# Patient Record
Sex: Male | Born: 1960 | Race: White | Hispanic: No | Marital: Married | Smoking: Never smoker
Health system: Southern US, Community
[De-identification: ages and names within clinical notes are randomized; demographics above are authoritative.]

## PROBLEM LIST (undated history)

## (undated) DIAGNOSIS — G4700 Insomnia, unspecified: Secondary | ICD-10-CM

## (undated) DIAGNOSIS — R0602 Shortness of breath: Secondary | ICD-10-CM

## (undated) DIAGNOSIS — K635 Polyp of colon: Secondary | ICD-10-CM

## (undated) DIAGNOSIS — I4891 Unspecified atrial fibrillation: Secondary | ICD-10-CM

## (undated) DIAGNOSIS — R002 Palpitations: Secondary | ICD-10-CM

## (undated) HISTORY — PX: OTHER SURGICAL HISTORY: SHX169

## (undated) HISTORY — DX: Insomnia, unspecified: G47.00

## (undated) HISTORY — PX: HERNIA REPAIR: SHX51

## (undated) HISTORY — DX: Shortness of breath: R06.02

## (undated) HISTORY — DX: Palpitations: R00.2

## (undated) HISTORY — DX: Polyp of colon: K63.5

---

## 2015-04-29 ENCOUNTER — Emergency Department (HOSPITAL_COMMUNITY): Payer: 59

## 2015-04-29 ENCOUNTER — Emergency Department (HOSPITAL_COMMUNITY)
Admission: EM | Admit: 2015-04-29 | Discharge: 2015-04-29 | Disposition: A | Discharge status: Home / Self Care | Payer: 59 | Attending: Emergency Medicine | Admitting: Emergency Medicine

## 2015-04-29 ENCOUNTER — Encounter (HOSPITAL_COMMUNITY): Payer: Self-pay | Admitting: *Deleted

## 2015-04-29 DIAGNOSIS — Z791 Long term (current) use of non-steroidal anti-inflammatories (NSAID): Secondary | ICD-10-CM | POA: Diagnosis not present

## 2015-04-29 DIAGNOSIS — F809 Developmental disorder of speech and language, unspecified: Secondary | ICD-10-CM | POA: Insufficient documentation

## 2015-04-29 DIAGNOSIS — R51 Headache: Secondary | ICD-10-CM | POA: Insufficient documentation

## 2015-04-29 DIAGNOSIS — R519 Headache, unspecified: Secondary | ICD-10-CM

## 2015-04-29 DIAGNOSIS — Z7982 Long term (current) use of aspirin: Secondary | ICD-10-CM | POA: Diagnosis not present

## 2015-04-29 DIAGNOSIS — I4891 Unspecified atrial fibrillation: Secondary | ICD-10-CM | POA: Diagnosis not present

## 2015-04-29 DIAGNOSIS — Z79899 Other long term (current) drug therapy: Secondary | ICD-10-CM | POA: Diagnosis not present

## 2015-04-29 DIAGNOSIS — IMO0002 Reserved for concepts with insufficient information to code with codable children: Secondary | ICD-10-CM

## 2015-04-29 HISTORY — DX: Unspecified atrial fibrillation: I48.91

## 2015-04-29 LAB — I-STAT CHEM 8, ED
BUN: 18 mg/dL (ref 6–20)
CALCIUM ION: 1.2 mmol/L (ref 1.12–1.23)
CHLORIDE: 103 mmol/L (ref 101–111)
Creatinine, Ser: 0.8 mg/dL (ref 0.61–1.24)
Glucose, Bld: 87 mg/dL (ref 65–99)
HEMATOCRIT: 46 % (ref 39.0–52.0)
Hemoglobin: 15.6 g/dL (ref 13.0–17.0)
POTASSIUM: 4 mmol/L (ref 3.5–5.1)
SODIUM: 142 mmol/L (ref 135–145)
TCO2: 27 mmol/L (ref 0–100)

## 2015-04-29 LAB — DIFFERENTIAL
BASOS ABS: 0 10*3/uL (ref 0.0–0.1)
Basophils Relative: 1 %
EOS ABS: 0.1 10*3/uL (ref 0.0–0.7)
Eosinophils Relative: 2 %
LYMPHS ABS: 1.8 10*3/uL (ref 0.7–4.0)
LYMPHS PCT: 27 %
Monocytes Absolute: 0.4 10*3/uL (ref 0.1–1.0)
Monocytes Relative: 5 %
NEUTROS PCT: 65 %
Neutro Abs: 4.2 10*3/uL (ref 1.7–7.7)

## 2015-04-29 LAB — COMPREHENSIVE METABOLIC PANEL
ALBUMIN: 4 g/dL (ref 3.5–5.0)
ALT: 22 U/L (ref 17–63)
AST: 27 U/L (ref 15–41)
Alkaline Phosphatase: 56 U/L (ref 38–126)
Anion gap: 7 (ref 5–15)
BILIRUBIN TOTAL: 0.4 mg/dL (ref 0.3–1.2)
BUN: 14 mg/dL (ref 6–20)
CO2: 29 mmol/L (ref 22–32)
Calcium: 9.3 mg/dL (ref 8.9–10.3)
Chloride: 104 mmol/L (ref 101–111)
Creatinine, Ser: 0.88 mg/dL (ref 0.61–1.24)
GFR calc Af Amer: >60 mL/min (ref >60)
GFR calc non Af Amer: >60 mL/min (ref >60)
GLUCOSE: 97 mg/dL (ref 65–99)
POTASSIUM: 4.1 mmol/L (ref 3.5–5.1)
SODIUM: 140 mmol/L (ref 135–145)
TOTAL PROTEIN: 6.9 g/dL (ref 6.5–8.1)

## 2015-04-29 LAB — CBC
HCT: 42.6 % (ref 39.0–52.0)
HEMOGLOBIN: 14.4 g/dL (ref 13.0–17.0)
MCH: 30.3 pg (ref 26.0–34.0)
MCHC: 33.8 g/dL (ref 30.0–36.0)
MCV: 89.7 fL (ref 78.0–100.0)
PLATELETS: 206 10*3/uL (ref 150–400)
RBC: 4.75 MIL/uL (ref 4.22–5.81)
RDW: 12.7 % (ref 11.5–15.5)
WBC: 6.4 10*3/uL (ref 4.0–10.5)

## 2015-04-29 LAB — CBG MONITORING, ED: GLUCOSE-CAPILLARY: 87 mg/dL (ref 65–99)

## 2015-04-29 LAB — PROTIME-INR
INR: 1.14 (ref 0.00–1.49)
Prothrombin Time: 14.7 seconds (ref 11.6–15.2)

## 2015-04-29 LAB — APTT: APTT: 35 s (ref 24–37)

## 2015-04-29 LAB — I-STAT TROPONIN, ED: Troponin i, poc: 0.01 ng/mL (ref 0.00–0.08)

## 2015-04-29 MED ORDER — ACETAMINOPHEN 500 MG PO TABS
1000.0000 mg | ORAL_TABLET | Freq: Once | ORAL | Status: AC
  Administered 2015-04-29: 1000 mg via ORAL
  Filled 2015-04-29: qty 2

## 2015-04-29 NOTE — ED Provider Notes (Signed)
CSN: [id number]     Arrival date & time 04/29/15  1548 History   First MD Initiated Contact with Patient 04/29/15 1657     Chief Complaint  Patient presents with  . Headache     (Consider location/radiation/quality/duration/timing/severity/associated sxs/prior Treatment) Patient is a 55 y.o. male presenting with neurologic complaint. The history is provided by the patient.  Neurologic Problem This is a new problem. The current episode started 3 to 5 hours ago. The problem occurs constantly. The problem has not changed since onset.Associated symptoms include headaches (started 1 hour following speech difficulty). Nothing aggravates the symptoms. Nothing relieves the symptoms. He has tried nothing for the symptoms.    Past Medical History  Diagnosis Date  . A-fib [facility])    History reviewed. No pertinent past surgical history. No family history on file. Social History  Substance Use Topics  . Smoking status: Never Smoker   . Smokeless tobacco: None  . Alcohol Use: None    Review of Systems  Neurological: Positive for headaches (started 1 hour following speech difficulty).  All other systems reviewed and are negative.     Allergies  Review of patient's allergies indicates no known allergies.  Home Medications   Prior to Admission medications   Medication Sig Start Date End Date Taking? Authorizing Provider  aspirin EC 81 MG tablet Take 81 mg by mouth daily.    Historical Provider, MD  carisoprodol (SOMA) 350 MG tablet Take 350 mg by mouth every 12 (twelve) hours. 03/24/15   Historical Provider, MD  CARTIA XT 120 MG 24 hr capsule Take 120 mg by mouth daily. 03/19/15   Historical Provider, MD  meloxicam (MOBIC) 7.5 MG tablet Take 7.5 mg by mouth daily. 04/17/15   Historical Provider, MD  pantoprazole (PROTONIX) 40 MG tablet Take 40 mg by mouth daily. 03/15/15   Historical Provider, MD  pyridostigmine (MESTINON) 60 MG tablet Take 30 mg by mouth 3 (three) times daily.  04/23/15    Historical Provider, MD  zolpidem (AMBIEN) 10 MG tablet Take 10 mg by mouth at bedtime. 04/26/15   Historical Provider, MD   BP 128/90 mmHg  Pulse 57  Temp(Src) 98.8 F (37.1 C) (Oral)  Resp 16  SpO2 99% Physical Exam  Constitutional: He is oriented to person, place, and time. He appears well-developed and well-nourished. No distress.  HENT:  Head: Normocephalic and atraumatic.  Eyes: Conjunctivae are normal.  Neck: Neck supple. No tracheal deviation present.  Cardiovascular: Normal rate and regular rhythm.   Pulmonary/Chest: Effort normal. No respiratory distress.  Abdominal: Soft. He exhibits no distension.  Neurological: He is alert and oriented to person, place, and time. He has normal strength. No cranial nerve deficit or sensory deficit. Coordination normal. GCS eye subscore is 4. GCS verbal subscore is 5. GCS motor subscore is 6.  Normal finger to nose testing and rapid alternating movement   Skin: Skin is warm and dry.  Psychiatric: He has a normal mood and affect.    ED Course  Procedures (including critical care time) Labs Review Labs Reviewed  PROTIME-INR  APTT  CBC  DIFFERENTIAL  COMPREHENSIVE METABOLIC PANEL  I-STAT TROPOININ, ED  CBG MONITORING, ED  I-STAT CHEM 8, ED    Imaging Review Ct Head Wo Contrast  04/29/2015  CLINICAL DATA:  Difficulty speaking for the past 30 minutes. Some blurry vision. EXAM: CT HEAD WITHOUT CONTRAST TECHNIQUE: Contiguous axial images were obtained from the base of the skull through the vertex without intravenous contrast. COMPARISON:  None. FINDINGS: The ventricles are normal in size and configuration. No extra-axial fluid collections are identified. The gray-white differentiation is normal. No CT findings for acute intracranial process such as hemorrhage or infarction. No mass lesions. The brainstem and cerebellum are grossly normal. The bony structures are intact. The paranasal sinuses and mastoid air cells are clear. The globes are  intact. IMPRESSION: Normal head CT. Electronically Signed   By: Rudie Meyer M.D.   On: 04/29/2015 16:57   Mr Brain Wo Contrast  04/29/2015  CLINICAL DATA:  55 year old male with headaches since 1330 hours and slurred speech. Initial encounter. EXAM: MRI HEAD WITHOUT CONTRAST TECHNIQUE: Multiplanar, multiecho pulse sequences of the brain and surrounding structures were obtained without intravenous contrast. COMPARISON:  Head CT without contrast 1651 hours today. [facility] Cervical spine MRI 02/04/2010. FINDINGS: Cerebral volume is normal. No restricted diffusion to suggest acute infarction. No midline shift, mass effect, evidence of mass lesion, ventriculomegaly, extra-axial collection or acute intracranial hemorrhage. Cervicomedullary junction and pituitary are within normal limits. Major intracranial vascular flow voids are preserved with mild intracranial artery tortuosity. Wallace Cullens and white matter signal is within normal limits throughout the brain. No encephalomalacia or chronic cerebral blood products. Visible internal auditory structures appear normal. Mastoids are clear. Paranasal sinuses are clear. Negative orbit and scalp soft tissues. Visualized bone marrow signal is within normal limits. 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 IMPRESSION: Normal for age noncontrast MRI appearance of the brain. Electronically Signed   By: Odessa Fleming M.D.   On: 04/29/2015 20:34   I have personally reviewed and evaluated these images and lab results as part of my medical decision-making.   EKG Interpretation   Date/Time:  Thursday April 29 2015 16:00:26 EST Ventricular Rate:  62 PR Interval:  152 QRS Duration: 110 QT Interval:  414 QTC Calculation: 420 R Axis:   -71 Text Interpretation:  Normal sinus rhythm with sinus arrhythmia Left  anterior fascicular block Abnormal ECG No previous tracing Confirmed by  Raevyn Sokol MD, Reuel Boom ([id number]) on 04/29/2015  4:58:55 PM      MDM   Final diagnoses:  Speech/language problem  Nonintractable headache, unspecified chronicity pattern, unspecified headache type    55 y.o. male presents with speech difficulty starting at work earlier today. He had difficulty pronouncing words and felt "weird" with speech and processing words but has difficulty describing his symptoms. Later he developed a mild headache which he still has. Initial stroke workup negative without focal deficits. D/t sudden onset symptoms discussed with neurology o/c who agreed with MR to r/o stroke. No acute ischemic findings on MR. Headache treated with tylenol. Plan to follow up with PCP as needed and return precautions discussed for worsening or new concerning symptoms.     Lyndal Pulley, MD 04/30/15 [phone]

## 2015-04-29 NOTE — Discharge Instructions (Signed)

## 2015-04-29 NOTE — ED Notes (Addendum)
Pt reports a headache that started at 1:30pm with some slurred speech. Pt states that the speech has resolved but the headache has continued. Pt reports rt eye droop at baseline for 6 month. No neuro deficits at triage.

## 2017-03-04 IMAGING — CT CT HEAD W/O CM
2 series · 15 of 30 positions shown, 17 images · non-contrast
Comparison: None.

CLINICAL DATA: Difficulty speaking for the past 30 minutes. Some
blurry vision.

EXAM:
CT HEAD WITHOUT CONTRAST
TECHNIQUE: Contiguous axial images were obtained from the base of the skull
through the vertex without intravenous contrast.

[Series 2: head without · axial · non-contrast · 0.45mm/px · z∈[-97,+23]mm · 7 of 33 slices shown, 9 images]
[im 5/33  brain]
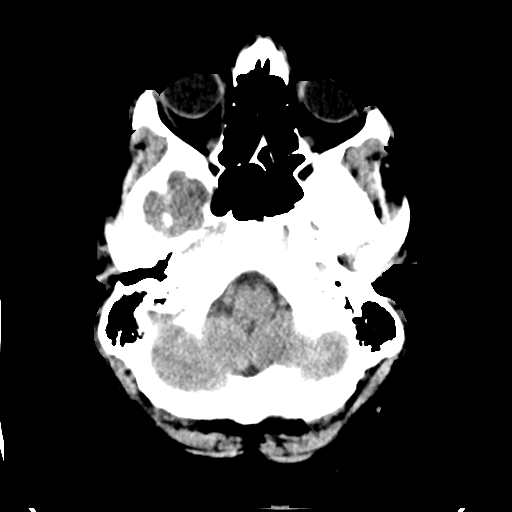
[im 5/33  bone]
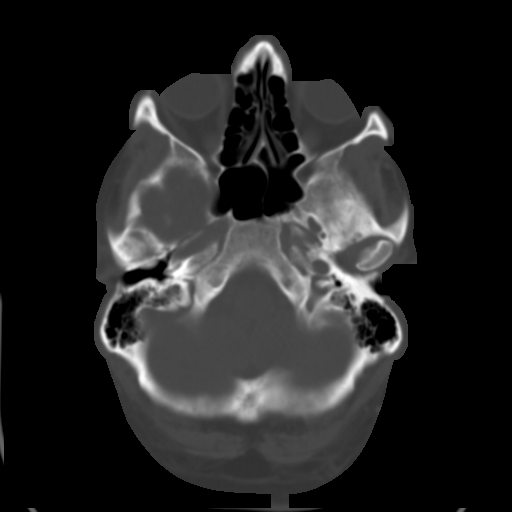
[im 9/33  brain]
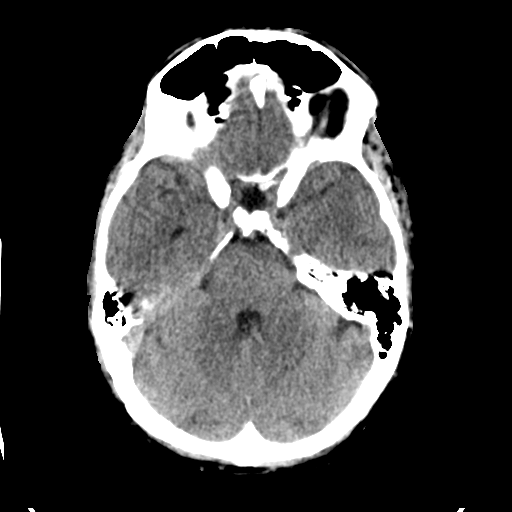
[im 13/33  brain]
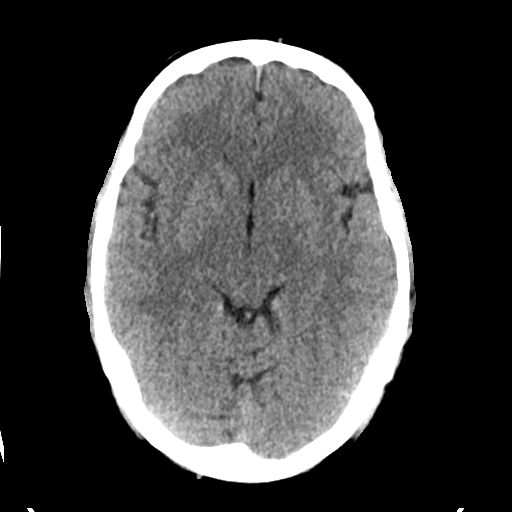
[im 17/33  brain]
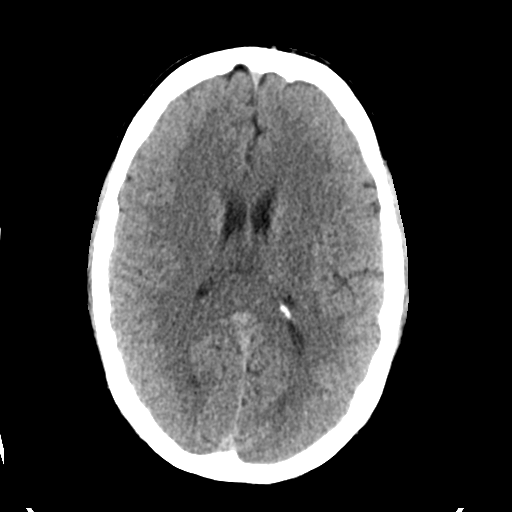
[im 21/33  brain]
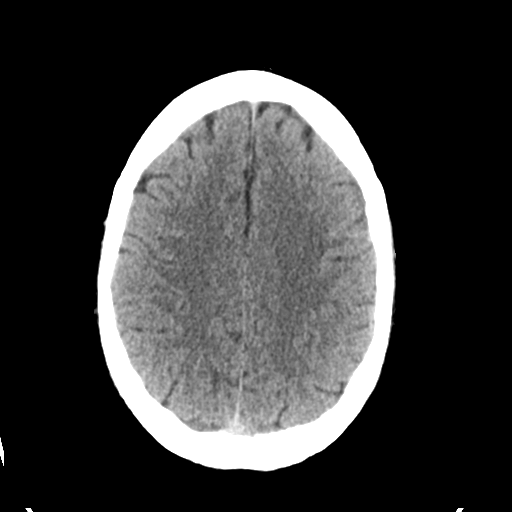
[im 21/33  bone]
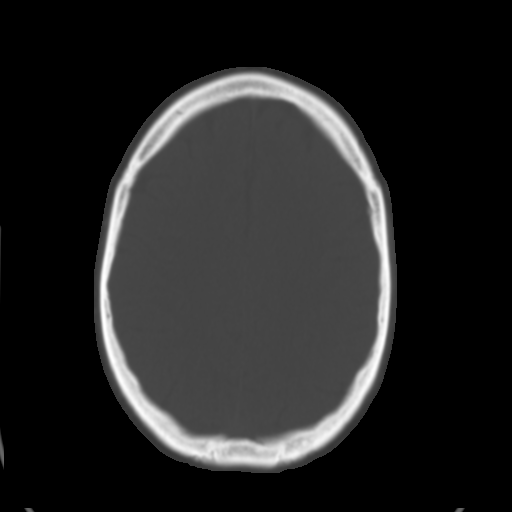
[im 25/33  brain]
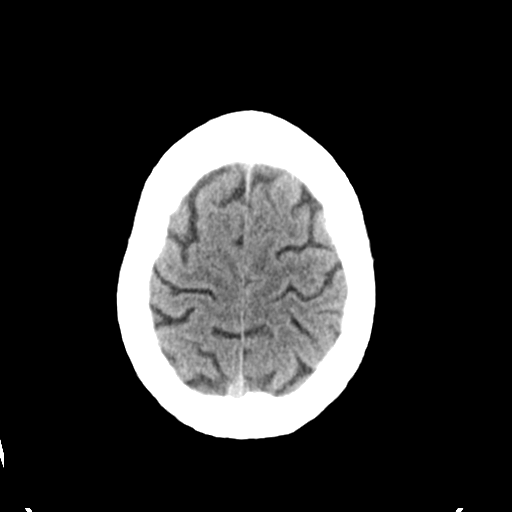
[im 29/33  brain]
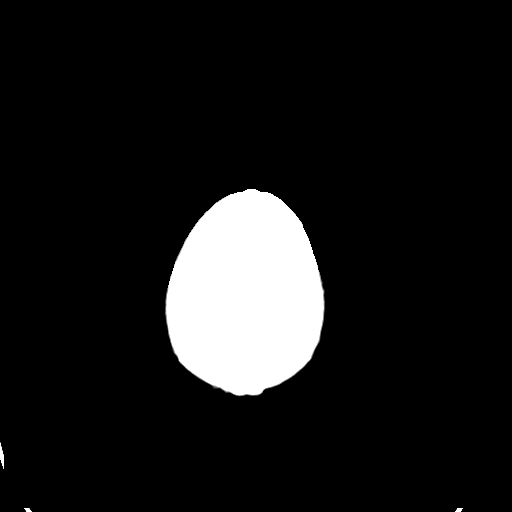

[Series 3: head bone · axial · 0.45mm/px · z∈[-101,+29]mm · 8 of 83 slices shown]
[im 9/83  bone]
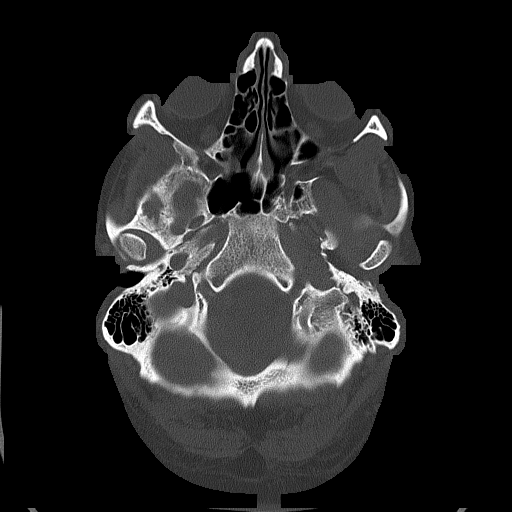
[im 17/83  bone]
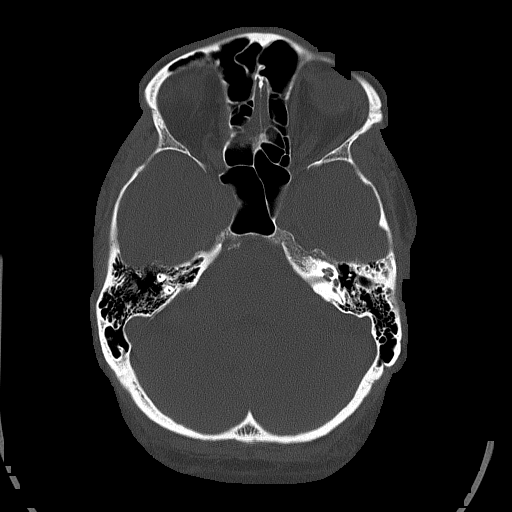
[im 25/83  bone]
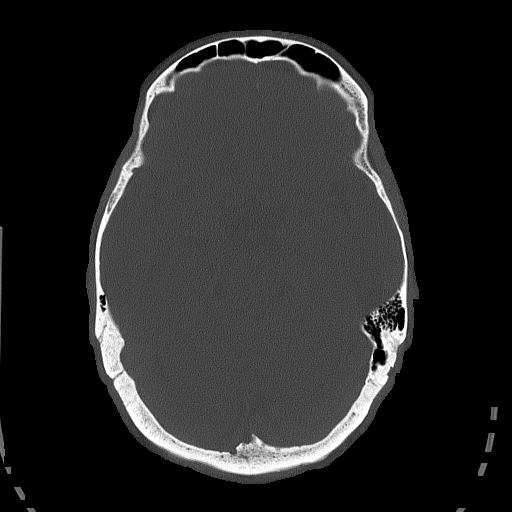
[im 37/83  bone]
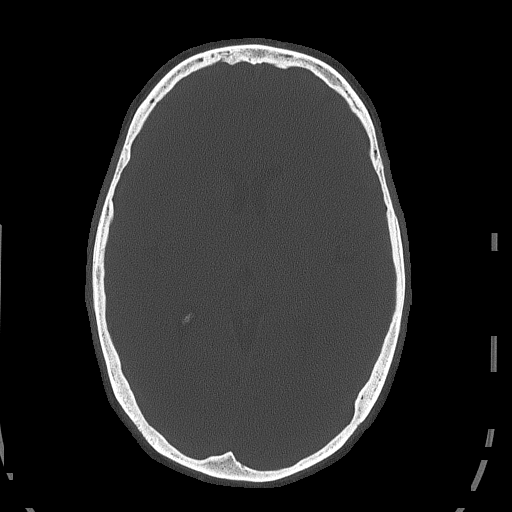
[im 46/83  bone]
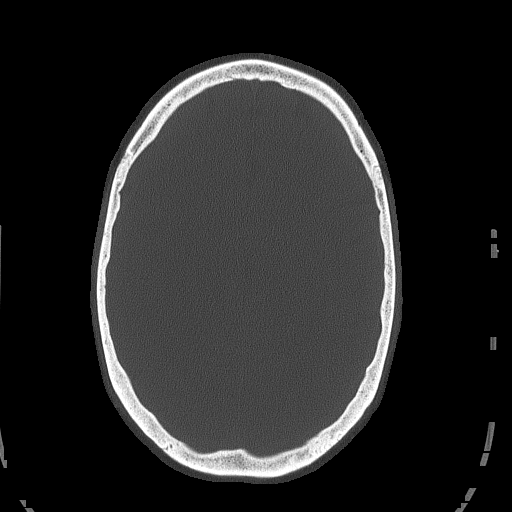
[im 58/83  bone]
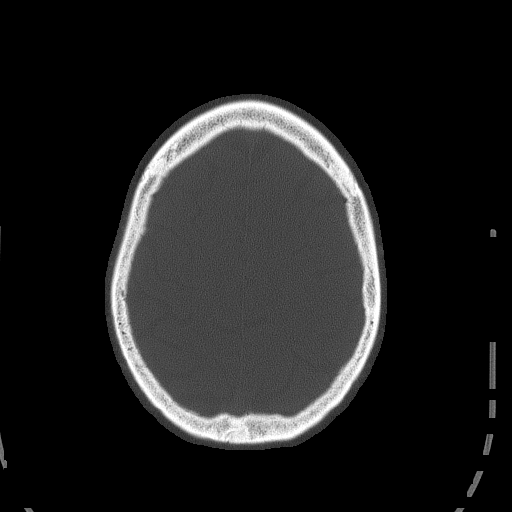
[im 66/83  bone]
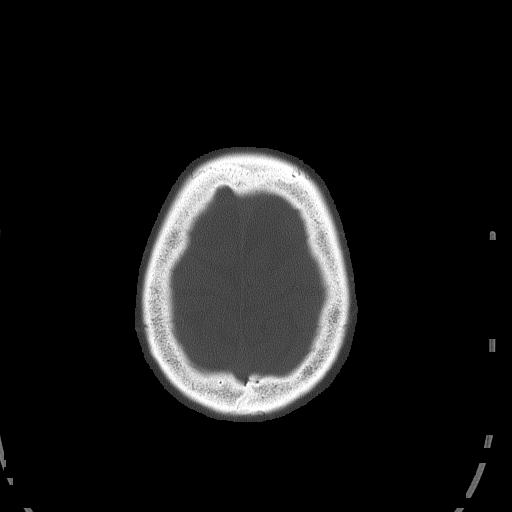
[im 74/83  bone]
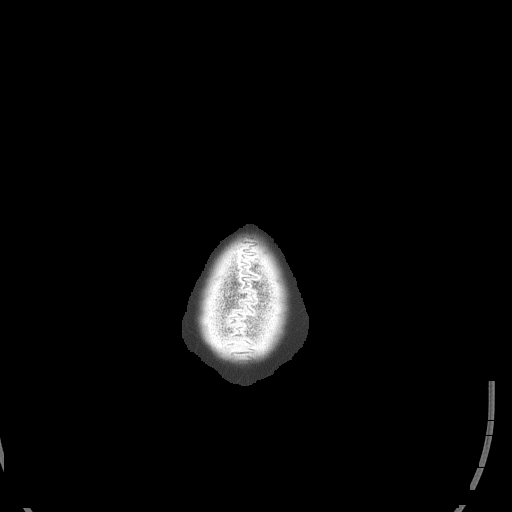

[15 of 30 positions shown; findings below may reference images not displayed]

FINDINGS: The ventricles are normal in size and configuration. No extra-axial
fluid collections are identified. The gray-white differentiation is
normal. No CT findings for acute intracranial process such as
hemorrhage or infarction. No mass lesions. The brainstem and
cerebellum are grossly normal.

The bony structures are intact. The paranasal sinuses and mastoid
air cells are clear. The globes are intact.
IMPRESSION: Normal head CT.

## 2019-01-09 ENCOUNTER — Other Ambulatory Visit: Payer: Self-pay

## 2019-01-09 ENCOUNTER — Ambulatory Visit (INDEPENDENT_AMBULATORY_CARE_PROVIDER_SITE_OTHER): Payer: Managed Care, Other (non HMO) | Admitting: Cardiovascular Disease

## 2019-01-09 ENCOUNTER — Encounter (INDEPENDENT_AMBULATORY_CARE_PROVIDER_SITE_OTHER): Payer: Self-pay

## 2019-01-09 ENCOUNTER — Encounter: Payer: Self-pay | Admitting: Cardiovascular Disease

## 2019-01-09 VITALS — BP 120/72 | HR 66 | Ht 73.0 in | Wt 248.8 lb

## 2019-01-09 DIAGNOSIS — I4891 Unspecified atrial fibrillation: Secondary | ICD-10-CM | POA: Diagnosis not present

## 2019-01-09 DIAGNOSIS — R9431 Abnormal electrocardiogram [ECG] [EKG]: Secondary | ICD-10-CM

## 2019-01-09 DIAGNOSIS — Z13220 Encounter for screening for lipoid disorders: Secondary | ICD-10-CM

## 2019-01-09 MED ORDER — CARTIA XT 120 MG PO CP24: 120.0000 mg | ORAL_CAPSULE | Freq: Every day | ORAL | 3 refills | Status: DC

## 2019-01-09 MED ORDER — APIXABAN 5 MG PO TABS: 5.0000 mg | ORAL_TABLET | Freq: Two times a day (BID) | ORAL | 3 refills | Status: DC

## 2019-01-09 NOTE — Patient Instructions (Addendum)
Medication Instructions:  Your physician recommends that you continue on your current medications as directed. Please refer to the Current Medication list given to you today. Refills of your cardiac medications have been sent to your pharmacy   Lab work: Your physician recommends that you return for lab work in: 12 months on the day of or a few days before your office visit with Dr. Acie Fredrickson.  You will need to FAST for this appointment - nothing to eat or drink after midnight the night before except water.      Testing/Procedures: Your physician has requested that you have an echocardiogram. Echocardiography is a painless test that uses sound waves to create images of your heart. It provides your doctor with information about the size and shape of your heart and how well your heart's chambers and valves are working. This procedure takes approximately one hour. There are no restrictions for this procedure.    Follow-Up: At [facility], you and your health needs are our priority.  As part of our continuing mission to provide you with exceptional heart care, we have created designated Provider Care Teams.  These Care Teams include your primary Cardiologist (physician) and Advanced Practice Providers (APPs -  Physician Assistants and Nurse Practitioners) who all work together to provide you with the care you need, when you need it. You will need a follow up appointment in:  1 years.  Please call our office 2 months in advance to schedule this appointment.  You may see Dr. Acie Fredrickson or one of the following Advanced Practice Providers on your designated Care Team: Richardson Dopp, PA-C [place], [place] . Daune Perch, NP

## 2019-01-09 NOTE — Progress Notes (Addendum)
Cardiology Office Note:    Date:  01/09/2019   ID:  Phillip SpellerDavid Raymond, DOB 06/15/1960, MRN [id number]  PCP:  [name], [name], PA-C  Cardiologist:  [name]  Electrophysiologist:  None   Referring MD: No ref. provider found   Chief Complaint  Patient presents with  . Atrial Fibrillation    History of Present Illness:    Phillip Raymond is a 58 y.o. male with a hx of atrial fib .  He was diagnosed with atrial fibrillation in 2016.  He has been on Eliquis and Cardizem and [facility] .  He was at one point prescribed flecainide but he did not want to take it.  He is not had any bleeding complications.  He seen today for the first time to establish cardiology care.  [place].   Works in [employer].   No CP or dyspnea.   Has mild DOE ( attributes this to his weight )  Exercises regularly .   Walks 3-4 miles a day .  [place]  He was working remotely up until last week.  He is now back in [place] and has not yet established his walking routine.     Has PAF .   Has only had 2 episodes of PAF .  Last one was 2 years ago  Eats a vegan diet .  Has cut his caffiene        Past Medical History:  Diagnosis Date  . A-fib (HCC)   . Colon polyp   . Insomnia   . Palpitations   . SOB (shortness of breath)     Past Surgical History:  Procedure Laterality Date  . HERNIA REPAIR    . SINUS VENOSUS CLOSURE      Current Medications: Current Meds  Medication Sig  . apixaban (ELIQUIS) 5 MG TABS tablet Take 1 tablet (5 mg total) by mouth 2 (two) times daily.  . Ascorbic Acid (VITAMIN C) 100 MG tablet Take by mouth.  . carisoprodol (SOMA) 350 MG tablet Take 350 mg by mouth every 12 (twelve) hours.  Marland Kitchen. CARTIA XT 120 MG 24 hr capsule Take 1 capsule (120 mg total) by mouth daily.  . ferrous sulfate 325 (65 FE) MG tablet Take by mouth.  . meloxicam (MOBIC) 7.5 MG tablet Take 7.5 mg by mouth daily.  Marland Kitchen.  tiZANidine (ZANAFLEX) 2 MG tablet   . TURMERIC PO Take 500 mg by mouth daily.  . vitamin B-12 (CYANOCOBALAMIN) 1000 MCG tablet Take by mouth.  . zolpidem (AMBIEN) 10 MG tablet Take 10 mg by mouth at bedtime.  . [DISCONTINUED] apixaban (ELIQUIS) 5 MG TABS tablet Take 5 mg by mouth 2 (two) times daily.  . [DISCONTINUED] CARTIA XT 120 MG 24 hr capsule Take 120 mg by mouth daily.     Allergies:   Patient has no known allergies.   Social History   Socioeconomic History  . Marital status: Single    Spouse name: Not on file  . Number of children: Not on file  . Years of education: Not on file  . Highest education level: Not on file  Occupational History  . Not on file  Social Needs  . Financial resource strain: Not on file  . Food insecurity    Worry: Not on file    Inability: Not on file  . Transportation needs    Medical: Not on file    Non-medical: Not on  file  Tobacco Use  . Smoking status: Never Smoker  . Smokeless tobacco: Never Used  Substance and Sexual Activity  . Alcohol use: Yes  . Drug use: Never  . Sexual activity: Not on file  Lifestyle  . Physical activity    Days per week: Not on file    Minutes per session: Not on file  . Stress: Not on file  Relationships  . Social Musician on phone: Not on file    Gets together: Not on file    Attends religious service: Not on file    Active member of club or organization: Not on file    Attends meetings of clubs or organizations: Not on file    Relationship status: Not on file  Other Topics Concern  . Not on file  Social History Narrative  . Not on file     Family History: The patient's family history includes Cancer - Colon in his father; Diabetes in his father.  ROS:   Please see the history of present illness.     All other systems reviewed and are negative.  EKGs/Labs/Other Studies Reviewed:    The following studies were reviewed today:   EKG:   January 09, 2019: Normal sinus rhythm at 69  beats a minute.  Sinus arrhythmia.  Left axis deviation.  Recent Labs: No results found for requested labs within last 8760 hours.  Recent Lipid Panel No results found for: CHOL, TRIG, HDL, CHOLHDL, VLDL, LDLCALC, LDLDIRECT  Physical Exam:    VS:  BP 120/72   Pulse 66   Ht 6\' 1"  (1.854 m)   Wt 248 lb 12.8 oz (112.9 kg)   SpO2 96%   BMI 32.83 kg/m     Wt Readings from Last 3 Encounters:  01/09/19 248 lb 12.8 oz (112.9 kg)     GEN:  Well nourished, well developed in no acute distress HEENT: Normal NECK: No JVD; No carotid bruits LYMPHATICS: No lymphadenopathy CARDIAC: RRR, no murmurs, rubs, gallops RESPIRATORY:  Clear to auscultation without rales, wheezing or rhonchi  ABDOMEN: Soft, non-tender, non-distended MUSCULOSKELETAL:  No edema; No deformity  SKIN: Warm and dry NEUROLOGIC:  Alert and oriented x 3 PSYCHIATRIC:  Normal affect   ASSESSMENT:    1. Atrial fibrillation, unspecified type (HCC)   2. Right axis deviation   3. Lipid screening    PLAN:    In order of problems listed above:  1. Atrial fibrillation: The patient presents with a history of paroxysmal atrial fibrillation.  He said 2 episodes of A. fib in the past.  1 of them was associated with drinking wine so he now does not drink any wine.  He drinks occasional beer.  He is on Eliquis and diltiazem.  His labs from [facility] from 2 weeks ago look great.  I will see him again in 1 year.  We will recheck CBC and basic metabolic profile at that time.  He inquired about possible ablation.  At this time he is not having any episodes of PAF and so I do not think that it is needed.  I have advised him to give me a call he has any recurrent episodes of atrial fibrillation.   Addendum:  Pt had an echo which revealed normal LV systolic function, mild diastolic CHF, and mild pulmonary HTN  Notes from [facility], RN:   Reviewed results of echo with patient and wife who verbalized understanding. Patient  states he was asked if he  has lung disease during the echo and he wondered why he was asked that question. I reviewed the result of the mildly elevated pulmonary pressure and asked if he has ever been diagnosed with sleep apnea. He confirms that he was tested in the past but at that time he weighed less and his test results did not show sleep apnea. I reviewed the Epworth Sleepiness tool with him and his score was 18. I advised that I will make a referral for a sleep study and that our office will contact him regarding the scheduling of the sleep test. He also requests referral to Dr. Radford Pax for sleep medicine. I advised I will also send message to Dr. Acie Fredrickson for additional advice. Patient and his wife were thankful for the call  I agree with the plan to proceed with a sleep study and refer to Dr. Radford Pax if needed.   Medication Adjustments/Labs and Tests Ordered: Current medicines are reviewed at length with the patient today.  Concerns regarding medicines are outlined above.  Orders Placed This Encounter  Procedures  . Lipid Profile  . Basic Metabolic Panel (BMET)  . CBC  . Hepatic function panel  . EKG 12-Lead  . ECHOCARDIOGRAM COMPLETE   Meds ordered this encounter  Medications  . CARTIA XT 120 MG 24 hr capsule    Sig: Take 1 capsule (120 mg total) by mouth daily.    Dispense:  90 capsule    Refill:  3  . apixaban (ELIQUIS) 5 MG TABS tablet    Sig: Take 1 tablet (5 mg total) by mouth 2 (two) times daily.    Dispense:  180 tablet    Refill:  3    Patient Instructions  Medication Instructions:  Your physician recommends that you continue on your current medications as directed. Please refer to the Current Medication list given to you today. Refills of your cardiac medications have been sent to your pharmacy   Lab work: Your physician recommends that you return for lab work in: 12 months on the day of or a few days before your office visit with Dr. Acie Fredrickson.  You will need to FAST  for this appointment - nothing to eat or drink after midnight the night before except water.      Testing/Procedures: Your physician has requested that you have an echocardiogram. Echocardiography is a painless test that uses sound waves to create images of your heart. It provides your doctor with information about the size and shape of your heart and how well your heart's chambers and valves are working. This procedure takes approximately one hour. There are no restrictions for this procedure.    Follow-Up: At [facility], you and your health needs are our priority.  As part of our continuing mission to provide you with exceptional heart care, we have created designated Provider Care Teams.  These Care Teams include your primary Cardiologist (physician) and Advanced Practice Providers (APPs -  Physician Assistants and Nurse Practitioners) who all work together to provide you with the care you need, when you need it. You will need a follow up appointment in:  1 years.  Please call our office 2 months in advance to schedule this appointment.  You may see Dr. Acie Fredrickson or one of the following Advanced Practice Providers on your designated Care Team: Richardson Dopp, PA-C [place], [place] . Daune Perch, NP      Signed, Mertie Moores, MD  01/09/2019 5:52 PM    [place]

## 2019-01-15 ENCOUNTER — Telehealth: Payer: Self-pay

## 2019-01-15 ENCOUNTER — Ambulatory Visit (HOSPITAL_COMMUNITY): Payer: Managed Care, Other (non HMO) | Attending: Internal Medicine

## 2019-01-15 ENCOUNTER — Telehealth: Payer: Self-pay | Admitting: Nurse Practitioner

## 2019-01-15 ENCOUNTER — Other Ambulatory Visit: Payer: Self-pay

## 2019-01-15 DIAGNOSIS — G4730 Sleep apnea, unspecified: Secondary | ICD-10-CM

## 2019-01-15 DIAGNOSIS — I4891 Unspecified atrial fibrillation: Secondary | ICD-10-CM

## 2019-01-15 DIAGNOSIS — R9431 Abnormal electrocardiogram [ECG] [EKG]: Secondary | ICD-10-CM | POA: Diagnosis present

## 2019-01-15 NOTE — Telephone Encounter (Signed)
NOTES ON FILE FROM DR Tullos [phone], SENT REFERRAL TO SCHEDULING

## 2019-01-15 NOTE — Telephone Encounter (Signed)
-----   Message from Thayer Headings, MD sent at 01/15/2019  4:35 PM EDT ----- Normal / supranormal LV function.    Grade 1 diastolic dysfunction

## 2019-01-15 NOTE — Telephone Encounter (Signed)
Reviewed results of echo with patient and wife who verbalized understanding. Patient states he was asked if he has lung disease during the echo and he wondered why he was asked that question. I reviewed the result of the mildly elevated pulmonary pressure and asked if he has ever been diagnosed with sleep apnea. He confirms that he was tested in the past but at that time he weighed less and his test results did not show sleep apnea. I reviewed the Epworth Sleepiness tool with him and his score was 18. I advised that I will make a referral for a sleep study and that our office will contact him regarding the scheduling of the sleep test. He also requests referral to Dr. Radford Pax for sleep medicine. I advised I will also send message to Dr. Acie Fredrickson for additional advice. Patient and his wife were thankful for the call.

## 2019-01-16 ENCOUNTER — Telehealth: Payer: Self-pay | Admitting: *Deleted

## 2019-01-16 NOTE — Telephone Encounter (Signed)
I have addended my note with this information Will order a sleep study and refer him to Dr. Radford Pax for further evaluation if needed.

## 2019-01-16 NOTE — Telephone Encounter (Signed)
PA for sleep study submitted to [facility] via web portal.

## 2019-01-20 ENCOUNTER — Telehealth: Payer: Self-pay | Admitting: *Deleted

## 2019-01-20 NOTE — Telephone Encounter (Signed)
Staff message sent to [facility] received. Auth # [id number]. Valid dates 01/16/19 to 04/15/19. Ok to schedule sleep study.

## 2019-01-22 ENCOUNTER — Telehealth: Payer: Self-pay | Admitting: *Deleted

## 2019-01-22 NOTE — Telephone Encounter (Signed)
Patient is scheduled for lab study on 02/03/19. Ptis scheduled for COVID screening on 01/31/19 1:45 prior to SS. Patient understands her sleep study will be done at [facility] sleep lab. Patient understands she will receive a sleep packet in a week or so. Patient understands to call if she does not receive the sleep packet in a timely manner.  Left detailed message on voicemail with date and time of titration and informed patient to call back to confirm or reschedule.

## 2019-01-22 NOTE — Telephone Encounter (Signed)
-----   Message from Lauralee Evener, [place] sent at 01/20/2019 11:01 AM EDT ----- Novella Rob received. Auth # [id number]. Dates 01/16/19 to 04/15/19. Ok to schedule sleep study. ----- Message ----- From: Emmaline Life, RN Sent: 01/16/2019   3:00 PM EDT To: Lauralee Evener, CMA  Tobias Alexander,  Dr. Elmarie Shiley note has been amended. Thank you. ----- Message ----- From: Lauralee Evener, CMA Sent: 01/16/2019  11:15 AM EDT To: Emmaline Life, RN  The information in your telephone message has to be noted in a office note. Cannot be done through a phone call. MD had to also mention in office note that sleep study will be ordered. Have MD addend office note and resend request to me. I have to send note with request to insurance company.  Thanks,   Mariann Laster  ----- Message ----- From: Emmaline Life, RN Sent: 01/15/2019   5:23 PM EDT To: [facility] Alert Sleep Studies  Sleep study order placed on Dr. Acie Fredrickson patient Epworth score is 18  Thank you, MIchelle

## 2019-01-31 ENCOUNTER — Other Ambulatory Visit (HOSPITAL_COMMUNITY): Payer: Managed Care, Other (non HMO)

## 2019-02-03 ENCOUNTER — Encounter (HOSPITAL_BASED_OUTPATIENT_CLINIC_OR_DEPARTMENT_OTHER): Payer: Managed Care, Other (non HMO) | Admitting: Cardiology

## 2019-04-04 ENCOUNTER — Emergency Department (HOSPITAL_COMMUNITY): Payer: 59

## 2019-04-04 ENCOUNTER — Other Ambulatory Visit: Payer: Self-pay

## 2019-04-04 ENCOUNTER — Telehealth: Payer: Self-pay | Admitting: Physician Assistant

## 2019-04-04 ENCOUNTER — Emergency Department (HOSPITAL_COMMUNITY)
Admission: EM | Admit: 2019-04-04 | Discharge: 2019-04-04 | Disposition: A | Discharge status: Home / Self Care | Payer: 59 | Attending: Emergency Medicine | Admitting: Emergency Medicine

## 2019-04-04 DIAGNOSIS — R002 Palpitations: Secondary | ICD-10-CM | POA: Diagnosis present

## 2019-04-04 DIAGNOSIS — R079 Chest pain, unspecified: Secondary | ICD-10-CM

## 2019-04-04 DIAGNOSIS — I4891 Unspecified atrial fibrillation: Secondary | ICD-10-CM | POA: Diagnosis not present

## 2019-04-04 DIAGNOSIS — Z7901 Long term (current) use of anticoagulants: Secondary | ICD-10-CM | POA: Diagnosis not present

## 2019-04-04 DIAGNOSIS — Z79899 Other long term (current) drug therapy: Secondary | ICD-10-CM | POA: Diagnosis not present

## 2019-04-04 LAB — CBC WITH DIFFERENTIAL/PLATELET
Abs Immature Granulocytes: 0.02 10*3/uL (ref 0.00–0.07)
Basophils Absolute: 0 10*3/uL (ref 0.0–0.1)
Basophils Relative: 1 %
Eosinophils Absolute: 0.1 10*3/uL (ref 0.0–0.5)
Eosinophils Relative: 2 %
HCT: 44.6 % (ref 39.0–52.0)
Hemoglobin: 14.7 g/dL (ref 13.0–17.0)
Immature Granulocytes: 0 %
Lymphocytes Relative: 25 %
Lymphs Abs: 1.6 10*3/uL (ref 0.7–4.0)
MCH: 30.9 pg (ref 26.0–34.0)
MCHC: 33 g/dL (ref 30.0–36.0)
MCV: 93.9 fL (ref 80.0–100.0)
Monocytes Absolute: 0.6 10*3/uL (ref 0.1–1.0)
Monocytes Relative: 9 %
Neutro Abs: 3.9 10*3/uL (ref 1.7–7.7)
Neutrophils Relative %: 63 %
Platelets: 196 10*3/uL (ref 150–400)
RBC: 4.75 MIL/uL (ref 4.22–5.81)
RDW: 13.1 % (ref 11.5–15.5)
WBC: 6.1 10*3/uL (ref 4.0–10.5)
nRBC: 0 % (ref 0.0–0.2)

## 2019-04-04 LAB — BASIC METABOLIC PANEL
Anion gap: 9 (ref 5–15)
BUN: 11 mg/dL (ref 6–20)
CO2: 25 mmol/L (ref 22–32)
Calcium: 9.3 mg/dL (ref 8.9–10.3)
Chloride: 105 mmol/L (ref 98–111)
Creatinine, Ser: 0.73 mg/dL (ref 0.61–1.24)
GFR calc Af Amer: >60 mL/min (ref >60)
GFR calc non Af Amer: >60 mL/min (ref >60)
Glucose, Bld: 143 mg/dL — ABNORMAL HIGH (ref 70–99)
Potassium: 3.8 mmol/L (ref 3.5–5.1)
Sodium: 139 mmol/L (ref 135–145)

## 2019-04-04 LAB — MAGNESIUM: Magnesium: 2 mg/dL (ref 1.7–2.4)

## 2019-04-04 MED ORDER — METOPROLOL TARTRATE 5 MG/5ML IV SOLN
5.0000 mg | INTRAVENOUS | Status: DC | PRN
  Administered 2019-04-04 (×2): 5 mg via INTRAVENOUS
  Filled 2019-04-04 (×2): qty 5

## 2019-04-04 MED ORDER — PROPOFOL 10 MG/ML IV BOLUS
0.5000 mg/kg | Freq: Once | INTRAVENOUS | Status: AC
  Administered 2019-04-04: 55.6 mg via INTRAVENOUS
  Filled 2019-04-04: qty 20

## 2019-04-04 NOTE — ED Provider Notes (Signed)
.  Sedation  Date/Time: 04/04/2019 7:33 PM Performed by: Gwyneth Sprout, MD Authorized by: Gwyneth Sprout, MD   Consent:    Consent obtained:  Verbal   Consent given by:  Patient   Risks discussed:  Allergic reaction, dysrhythmia, inadequate sedation, nausea, prolonged hypoxia resulting in organ damage, prolonged sedation necessitating reversal, respiratory compromise necessitating ventilatory assistance and intubation and vomiting   Alternatives discussed:  Analgesia without sedation, anxiolysis and regional anesthesia Universal protocol:    Procedure explained and questions answered to patient or proxy's satisfaction: yes     Relevant documents present and verified: yes     Test results available and properly labeled: yes     Imaging studies available: yes     Required blood products, implants, devices, and special equipment available: yes     Site/side marked: yes     Immediately prior to procedure a time out was called: yes     Patient identity confirmation method:  Verbally with patient Indications:    Procedure necessitating sedation performed by:  Physician performing sedation Pre-sedation assessment:    Time since last food or drink:  8am   ASA classification: class 1 - normal, healthy patient     Neck mobility: normal     Mouth opening:  3 or more finger widths   Thyromental distance:  4 finger widths   Mallampati score:  I - soft palate, uvula, fauces, pillars visible   Pre-sedation assessments completed and reviewed: airway patency, cardiovascular function, hydration status, mental status, nausea/vomiting, pain level, respiratory function and temperature     Pre-sedation assessment completed:  04/04/2019 1:33 PM Immediate pre-procedure details:    Reassessment: Patient reassessed immediately prior to procedure     Reviewed: vital signs, relevant labs/tests and NPO status     Verified: bag valve mask available, emergency equipment available, intubation equipment available,  IV patency confirmed, oxygen available and suction available   Procedure details (see MAR for exact dosages):    Preoxygenation:  Nasal cannula   Sedation:  Propofol   Intended level of sedation: deep   Intra-procedure monitoring:  Blood pressure monitoring, cardiac monitor, continuous pulse oximetry, frequent LOC assessments, frequent vital sign checks and continuous capnometry   Intra-procedure events: none     Total Provider sedation time (minutes):  5 Post-procedure details:    Post-sedation assessment completed:  04/04/2019 2:00 PM   Attendance: Constant attendance by certified staff until patient recovered     Recovery: Patient returned to pre-procedure baseline     Post-sedation assessments completed and reviewed: airway patency, cardiovascular function, hydration status, mental status, nausea/vomiting, pain level, respiratory function and temperature     Patient is stable for discharge or admission: yes     Patient tolerance:  Tolerated well, no immediate complications      Gwyneth Sprout, MD 04/04/19 1934

## 2019-04-04 NOTE — Sedation Documentation (Signed)
ED Provider at bedside. 

## 2019-04-04 NOTE — ED Provider Notes (Addendum)
[facility] EMERGENCY DEPARTMENT Provider Note   CSN: [id number] Arrival date & time: 04/04/19  1155     History No chief complaint on file.   Phillip Raymond is a 59 y.o. male with history of atrial fibrillation on Eliquis and Cardizem diagnosed 2016, insomnia, anemia brought to the ER for evaluation of palpitations.  Onset around 9 AM today.  Associated with exertional chest pressure, lightheadedness and shortness of breath.  Symptoms feel similar to previous episodes of atrial fibrillation.  Patient states around 2 AM he felt "off".  He was unable to sleep and took zolpidem for insomnia.  He does not remember if he took 1 or an extra dose.  He woke up a few hours later around 9 AM and felt worsening palpitations, pressure in his chest, lightheadedness and shortness of breath.  He called PCP who recommended ER evaluation.  Has been compliant with Cardizem and Eliquis without missing any doses.  States in May 2020 he had a cough and felt a "pop" on his right lower lung has been giving him some discomfort here and there.  He denies any fever, cough.  Denies any recent illnesses.  Denies any EtOH use or illicit drug use.  He does not drink coffee.  States for the last year and a half he was a vegan and last night for [employer] celebration he had a regular meal for the first time and is not sure if this has contributed.  He was given 20 mg of Cardizem by EMS and 500 cc NS bolus.  He was initially noted to have heart rate of 140-180s which improved to 90-140s.  HPI     Past Medical History:  Diagnosis Date  . A-fib (HCC)   . Colon polyp   . Insomnia   . Palpitations   . SOB (shortness of breath)     There are no problems to display for this patient.   Past Surgical History:  Procedure Laterality Date  . HERNIA REPAIR    . SINUS VENOSUS CLOSURE         Family History  Problem Relation Age of Onset  . Diabetes Father   . Cancer - Colon Father     Social History    Tobacco Use  . Smoking status: Never Smoker  . Smokeless tobacco: Never Used  Substance Use Topics  . Alcohol use: Yes  . Drug use: Never    Home Medications Prior to Admission medications   Medication Sig Start Date End Date Taking? Authorizing Provider  apixaban (ELIQUIS) 5 MG TABS tablet Take 1 tablet (5 mg total) by mouth 2 (two) times daily. 01/09/19  Yes Nahser, Deloris Ping, MD  Ascorbic Acid (VITAMIN C) 1000 MG tablet Take 1,000 mg by mouth daily.  01/01/19  Yes [provider]  CARTIA XT 120 MG 24 hr capsule Take 1 capsule (120 mg total) by mouth daily. 01/09/19  Yes Nahser, Deloris Ping, MD  cetirizine (ZYRTEC) 10 MG tablet Take 10 mg by mouth daily.   Yes [provider]  ferrous sulfate 325 (65 FE) MG tablet Take 325 mg by mouth daily at 12 noon.  01/01/19  Yes [provider]  meloxicam (MOBIC) 7.5 MG tablet Take 7.5 mg by mouth daily. 04/17/15  Yes [provider]  Turmeric 450 MG CAPS Take 450 mg by mouth daily.    Yes [provider]  vitamin B-12 (CYANOCOBALAMIN) 1000 MCG tablet Take 1,000 mcg by mouth daily.  01/01/19  Yes [provider]  zolpidem (AMBIEN) 10 MG tablet Take 10 mg by mouth at bedtime. 04/26/15  Yes [provider]    Allergies    Patient has no known allergies.  Review of Systems   Review of Systems  Respiratory: Positive for shortness of breath.   Cardiovascular: Positive for chest pain and palpitations.  Neurological: Positive for dizziness and light-headedness.  Hematological: Bruises/bleeds easily.  All other systems reviewed and are negative.   Physical Exam Updated Vital Signs BP 109/86   Pulse (!) 55   Temp 97.7 F (36.5 C) (Oral)   Resp 19   Ht 6' (1.829 m)   Wt 111.1 kg   SpO2 100%   BMI 33.23 kg/m   Physical Exam Constitutional:      Appearance: He is well-developed.     Comments: NAD. Non toxic.   HENT:     Head: Normocephalic and atraumatic.     Nose: Nose normal.    Eyes:     General: Lids are normal.     Conjunctiva/sclera: Conjunctivae normal.  Neck:     Trachea: Trachea normal.     Comments: Trachea midline.  Cardiovascular:     Rate and Rhythm: Tachycardia present. Rhythm irregularly irregular.     Pulses:          Radial pulses are 1+ on the right side and 1+ on the left side.       Dorsalis pedis pulses are 1+ on the right side and 1+ on the left side.     Heart sounds: Normal heart sounds, S1 normal and S2 normal.     Comments: No LE edema or calf tenderness.  Pulmonary:     Effort: Pulmonary effort is normal.     Breath sounds: Normal breath sounds.  Abdominal:     General: Bowel sounds are normal.     Palpations: Abdomen is soft.     Tenderness: There is no abdominal tenderness.     Comments: No epigastric tenderness. No distention.   Musculoskeletal:     Cervical back: Normal range of motion.  Skin:    General: Skin is warm and dry.     Capillary Refill: Capillary refill takes less than 2 seconds.     Comments: No rash to chest wall  Neurological:     Mental Status: He is alert.     GCS: GCS eye subscore is 4. GCS verbal subscore is 5. GCS motor subscore is 6.  Psychiatric:        Speech: Speech normal.        Behavior: Behavior normal.        Thought Content: Thought content normal.     ED Results / Procedures / Treatments   Labs (all labs ordered are listed, but only abnormal results are displayed) Labs Reviewed  BASIC METABOLIC PANEL - Abnormal; Notable for the following components:      Result Value   Glucose, Bld 143 (*)    All other components within normal limits  MAGNESIUM  CBC WITH DIFFERENTIAL/PLATELET    EKG None  Radiology DG Chest 1 View  Result Date: 04/04/2019 CLINICAL DATA:  Shortness of breath for 30 days. Right-sided chest pain. EXAM: CHEST  1 VIEW COMPARISON:  06/03/2014, report only FINDINGS: Numerous leads and wires project over the chest. Midline trachea. Borderline cardiomegaly. Mild  hyperinflation. Left costophrenic angle is partially excluded. No pleural effusion or pneumothorax. Clear lungs. IMPRESSION: No acute cardiopulmonary disease. Electronically Signed   By:  Abigail Miyamoto M.D.   On: 04/04/2019 12:55    Procedures .Cardioversion  Date/Time: 04/04/2019 3:19 PM Performed by: Kinnie Feil, PA-C Authorized by: Kinnie Feil, PA-C   Consent:    Consent obtained:  Written   Consent given by:  Patient   Risks discussed:  Cutaneous burn, death, induced arrhythmia and pain   Alternatives discussed:  Rate-control medication, referral, delayed treatment and observation Pre-procedure details:    Cardioversion basis:  Emergent   Rhythm:  Atrial fibrillation   Electrode placement:  Anterior-posterior Patient sedated: Yes. Refer to sedation procedure documentation for details of sedation.  Attempt one:    Cardioversion mode:  Synchronous   Shock (joules) attempt one: 120.   Shock outcome:  Conversion to normal sinus rhythm Post-procedure details:    Patient status:  Awake   Patient tolerance of procedure:  Tolerated well, no immediate complications  .Critical Care Performed by: Kinnie Feil, PA-C Authorized by: Kinnie Feil, PA-C   Critical care provider statement:    Critical care time (minutes):  45   Critical care was necessary to treat or prevent imminent or life-threatening deterioration of the following conditions:  CNS failure or compromise (arrhythmias, cardioversion)   Critical care was time spent personally by me on the following activities:  Discussions with consultants, evaluation of patient's response to treatment, examination of patient, ordering and performing treatments and interventions, ordering and review of laboratory studies, ordering and review of radiographic studies, pulse oximetry, re-evaluation of patient's condition, obtaining history from patient or surrogate, review of old charts and development of treatment plan with  patient or surrogate   I assumed direction of critical care for this patient from another provider in my specialty: no     (including critical care time)  Medications Ordered in ED Medications  metoprolol tartrate (LOPRESSOR) injection 5 mg (5 mg Intravenous Given 04/04/19 1310)  propofol (DIPRIVAN) 10 mg/mL bolus/IV push 55.6 mg (55.6 mg Intravenous Given 04/04/19 1411)    ED Course  I have reviewed the triage vital signs and the nursing notes.  Pertinent labs & imaging results that were available during my care of the patient were reviewed by me and considered in my medical decision making (see chart for details).  Clinical Course as of Apr 04 1603  Fri Apr 04, 2019  1519 NSR  EKG 12-Lead [CG]    Clinical Course User Index [CG] Kinnie Feil, PA-C   MDM Rules/Calculators/A&P                      CHADSVASC score = 0.  Age 74, no history of CHF, hypertension, stroke or thromboembolism, vascular disease or diabetes.  Patient has been compliant with Cardizem and Eliquis.  Denies any causes that could contribute to atrial fibrillation with RVR specifically recent illness, alcohol use, drug use, medical noncompliance.  No history of thyroid problems.  States he may have taken 2 of his zolpidem medicines last night for insomnia and recently stopped doing vegan diet but doubt these are contributing.  He was given 20 mg Cardizem on route and presents to ER with persistent atrial fibrillation with RVR in the heart rate range of 120s to 140s.  Hemodynamically stable.  He is symptomatic.  Reported chest pressure, shortness of breath with onset of symptoms  Labs, EKG, chest x-ray ordered.  Patient has been metoprolol IV but persistent elevated heart rate in atrial fibrillation.  1522: Patient was successfully cardioverted in the ER with  assistance of EDP without immediate complications.  Hemodynamically stable.  Lab work thus far is normal.  Chest x-ray without acute findings.  Repeat  EKG shows normal sinus rhythm.  I suspect his chest pressure shortness of breath are related to arrhythmia, palpitations sensation and less likely ACS.  He has been compliant with anticoagulation and PE is highly unlikely.   Pt ambulated and observed here after CV and remains in NSR.  Appropriate for discharge with follow-up with cardiology/atrial fibrillation clinic.  Shared with EDP.   Final Clinical Impression(s) / ED Diagnoses Final diagnoses:  Atrial fibrillation with RVR (HCC)  Chest pain in adult    Rx / DC Orders ED Discharge Orders         Ordered    Amb referral to AFIB Clinic     04/04/19 1308           Jerrell Mylar 04/04/19 1605    Gwyneth Sprout, MD 04/04/19 1937    Liberty Handy, PA-C 05/16/19 1148    Gwyneth Sprout, MD 05/16/19 1659

## 2019-04-04 NOTE — ED Notes (Signed)
Patient verbalizes understanding of discharge instructions. Opportunity for questioning and answers were provided. Armband removed by staff, pt discharged from ED to home with wife 

## 2019-04-04 NOTE — ED Triage Notes (Addendum)
Pt presents  From home for afib RVR. Awoke around 9:30a with racing HR, exertional chest pressure, and sob. H/o previosu episodes, has been successfully converted with medication once and electrically once in past. Pt adds feeling like he has had "a pressure" in his R lung x 4-6 mo, has had recent echo. Takes eliquis  EMS exam- Rate 140-180, improved ot 90-140 with 20mg  cardizema nd 500cc NS bolus.

## 2019-04-04 NOTE — Telephone Encounter (Signed)
59 yo male with a hx of parox atrial fibrillation.  He called the answering service with complaints of an irregular heart beat.  When I called him back, he had already called EMS and is now at the ED at [facility].   EDP to evaluate. Tereso Newcomer, PA-C    04/04/2019 11:57 AM

## 2019-04-04 NOTE — Discharge Instructions (Addendum)
You were seen in the ER for flare of atrial fibrillation and elevated heart rate  Work-up today was normal  You were cardioverted once in the ER successfully back to normal rhythm  Continue all your medicines, call your cardiologist/atrial fibrillation clinic and have a follow-up in the next 7 to 10 days to ensure symptoms have improved and no other medication changes needs to be done.  Return for return of symptoms, chest pain or shortness of breath with exertion, passing out or lightheadedness

## 2019-04-04 NOTE — ED Notes (Addendum)
Pt walks without difficulty in room, dresses independently. Denies SOB, CP. Wife to pick up from hospital.

## 2019-04-04 NOTE — Sedation Documentation (Signed)
syncronized cardioversion at 120J performed, return to sinus rhythm noted

## 2019-04-08 ENCOUNTER — Telehealth (HOSPITAL_COMMUNITY): Payer: Self-pay | Admitting: *Deleted

## 2019-04-08 NOTE — Telephone Encounter (Signed)
Received referral from ER for afib f/u - offered pt appt - pt states he has requested follow up with Dr. Elease Hashimoto. Instructed pt to callback if cannot get an appt in next 5-7 days since he was cardioverted in ER.

## 2019-04-10 ENCOUNTER — Telehealth: Payer: Self-pay | Admitting: *Deleted

## 2019-04-10 NOTE — Telephone Encounter (Signed)
PA for sleep study submitted to patient's new insurance [employer] via web portal, [email or web address]. (Patient's plan is not a standard Midwife.)

## 2019-04-11 ENCOUNTER — Ambulatory Visit (INDEPENDENT_AMBULATORY_CARE_PROVIDER_SITE_OTHER): Payer: PRIVATE HEALTH INSURANCE | Admitting: Cardiovascular Disease

## 2019-04-11 ENCOUNTER — Encounter: Payer: Self-pay | Admitting: Cardiovascular Disease

## 2019-04-11 ENCOUNTER — Other Ambulatory Visit: Payer: Self-pay

## 2019-04-11 DIAGNOSIS — I4891 Unspecified atrial fibrillation: Secondary | ICD-10-CM | POA: Insufficient documentation

## 2019-04-11 DIAGNOSIS — I480 Paroxysmal atrial fibrillation: Secondary | ICD-10-CM

## 2019-04-11 MED ORDER — APIXABAN 5 MG PO TABS: 5.0000 mg | ORAL_TABLET | Freq: Two times a day (BID) | ORAL | 3 refills | Status: AC

## 2019-04-11 MED ORDER — CARTIA XT 120 MG PO CP24: 120.0000 mg | ORAL_CAPSULE | Freq: Every day | ORAL | 3 refills | Status: AC

## 2019-04-11 NOTE — Progress Notes (Signed)
Cardiology Office Note:    Date:  04/11/2019   ID:  Phillip Raymond, DOB 10/21/1960, MRN [id number]  PCP:  Katherine Basset, PA-C  Cardiologist:  Sufian Ravi  Electrophysiologist:  None   Referring MD: Katherine Basset, PA-C   Chief Complaint  Patient presents with  . Atrial Fibrillation    History of Present Illness:    Phillip Raymond is a 59 y.o. male with a hx of atrial fib .  He was diagnosed with atrial fibrillation in 2016.  He has been on Eliquis and Cardizem and has been seen by the [facility] .  He was at one point prescribed flecainide but he did not want to take it.  He is not had any bleeding complications.  He seen today for the first time to establish cardiology care.  [place] moved [place].   Works in [employer] se[employer].   No CP or dyspnea.   Has mild DOE ( attributes this to his weight )  Exercises regularly .   Walks 3-4 miles a day .  [place].  He was working remotely up until last week.  He is now back in [place] and has not yet established his walking routine.     Has PAF .   Has only had 2 episodes of PAF .  Last one was 2 years ago  Eats a vegan diet .  Has cut his caffiene   Jan. 8, 2021  Phillip Raymond is seen back for follow-up of his atrial fibrillation. He woke up New years day with rapid AF. Went to the ER . HR was 180-190 Was given IV diltiazem but did not slow the heart rate much.  He had an urgent cardioversion and was discharged home in satisfactory condition.  He has remained on Eliquis .   Does not drink ETOH.  May have stayed up a bit later than usual .  Has scheduled his sleep study .  Still has a chonic cough.        Past Medical History:  Diagnosis Date  . A-fib (HCC)   . Colon polyp   . Insomnia   . Palpitations   . SOB (shortness of breath)     Past Surgical History:  Procedure Laterality Date  . HERNIA REPAIR    . SINUS VENOSUS CLOSURE      Current Medications: Current Meds    Medication Sig  . apixaban (ELIQUIS) 5 MG TABS tablet Take 1 tablet (5 mg total) by mouth 2 (two) times daily.  . Ascorbic Acid (VITAMIN C) 1000 MG tablet Take 1,000 mg by mouth daily.   Marland Kitchen CARTIA XT 120 MG 24 hr capsule Take 1 capsule (120 mg total) by mouth daily.  . cetirizine (ZYRTEC) 10 MG tablet Take 10 mg by mouth daily.  . ferrous sulfate 325 (65 FE) MG tablet Take 325 mg by mouth daily at 12 noon.   . Turmeric 450 MG CAPS Take 450 mg by mouth daily.   . vitamin B-12 (CYANOCOBALAMIN) 1000 MCG tablet Take 1,000 mcg by mouth daily.   Marland Kitchen zolpidem (AMBIEN) 10 MG tablet Take 10 mg by mouth at bedtime.  . [DISCONTINUED] apixaban (ELIQUIS) 5 MG TABS tablet Take 1 tablet (5 mg total) by mouth 2 (two) times daily.  . [DISCONTINUED] CARTIA XT 120 MG 24 hr capsule Take 1 capsule (120 mg total) by mouth daily.     Allergies:   Patient has no known allergies.   Social History  Socioeconomic History  . Marital status: Married    Spouse name: Not on file  . Number of children: Not on file  . Years of education: Not on file  . Highest education level: Not on file  Occupational History  . Not on file  Tobacco Use  . Smoking status: Never Smoker  . Smokeless tobacco: Never Used  Substance and Sexual Activity  . Alcohol use: Yes  . Drug use: Never  . Sexual activity: Not on file  Other Topics Concern  . Not on file  Social History Narrative  . Not on file   Social Determinants of Health   Financial Resource Strain:   . Difficulty of Paying Living Expenses: Not on file  Food Insecurity:   . Worried About Charity fundraiser in the Last Year: Not on file  . Ran Out of Food in the Last Year: Not on file  Transportation Needs:   . Lack of Transportation (Medical): Not on file  . Lack of Transportation (Non-Medical): Not on file  Physical Activity:   . Days of Exercise per Week: Not on file  . Minutes of Exercise per Session: Not on file  Stress:   . Feeling of Stress : Not on  file  Social Connections:   . Frequency of Communication with Friends and Family: Not on file  . Frequency of Social Gatherings with Friends and Family: Not on file  . Attends Religious Services: Not on file  . Active Member of Clubs or Organizations: Not on file  . Attends Archivist Meetings: Not on file  . Marital Status: Not on file     Family History: The patient's family history includes Cancer - Colon in his father; Diabetes in his father.  ROS:   Please see the history of present illness.     All other systems reviewed and are negative.  EKGs/Labs/Other Studies Reviewed:    The following studies were reviewed today:   EKG:   January 09, 2019: Normal sinus rhythm at 69 beats a minute.  Sinus arrhythmia.  Left axis deviation.  Recent Labs: 04/04/2019: BUN 11; Creatinine, Ser 0.73; Hemoglobin 14.7; Magnesium 2.0; Platelets 196; Potassium 3.8; Sodium 139  Recent Lipid Panel No results found for: CHOL, TRIG, HDL, CHOLHDL, VLDL, LDLCALC, LDLDIRECT  Physical Exam:    VS:  BP 100/64   Pulse 61   Ht 6' (1.829 m)   Wt 250 lb (113.4 kg)   SpO2 97%   BMI 33.91 kg/m     Wt Readings from Last 3 Encounters:  04/11/19 250 lb (113.4 kg)  04/04/19 245 lb (111.1 kg)  01/09/19 248 lb 12.8 oz (112.9 kg)     GEN:  Well nourished, well developed in no acute distress HEENT: Normal NECK: No JVD; No carotid bruits LYMPHATICS: No lymphadenopathy CARDIAC: RRR, no murmurs, rubs, gallops RESPIRATORY:  Clear to auscultation without rales, wheezing or rhonchi  ABDOMEN: Soft, non-tender, non-distended MUSCULOSKELETAL:  No edema; No deformity  SKIN: Warm and dry NEUROLOGIC:  Alert and oriented x 3 PSYCHIATRIC:  Normal affect   ASSESSMENT:    No diagnosis found. PLAN:    In order of problems listed above:  1. Atrial fibrillation: The patient presents with a history of paroxysmal atrial fibrillation.  He said 2 episodes of A. fib in the past.  1 of them was associated with  drinking wine so he now does not drink any wine.  He drinks occasional beer. He is made an appointment to  have a sleep study.  We discussed referring him to electrophysiology if needed.   Medication Adjustments/Labs and Tests Ordered: Current medicines are reviewed at length with the patient today.  Concerns regarding medicines are outlined above.  No orders of the defined types were placed in this encounter.  Meds ordered this encounter  Medications  . apixaban (ELIQUIS) 5 MG TABS tablet    Sig: Take 1 tablet (5 mg total) by mouth 2 (two) times daily.    Dispense:  180 tablet    Refill:  3  . CARTIA XT 120 MG 24 hr capsule    Sig: Take 1 capsule (120 mg total) by mouth daily.    Dispense:  90 capsule    Refill:  3    Patient Instructions  Medication Instructions:  Your physician recommends that you continue on your current medications as directed. Please refer to the Current Medication list given to you today.  *If you need a refill on your cardiac medications before your next appointment, please call your pharmacy*   Lab Work: None Ordered    Testing/Procedures: None Ordered   Follow-Up: At [facility], you and your health needs are our priority.  As part of our continuing mission to provide you with exceptional heart care, we have created designated Provider Care Teams.  These Care Teams include your primary Cardiologist (physician) and Advanced Practice Providers (APPs -  Physician Assistants and Nurse Practitioners) who all work together to provide you with the care you need, when you need it.  Your next appointment:   6 month(s)  The format for your next appointment:   Either In Person or Virtual  Provider:   You may see Kristeen Miss, MD or one of the following Advanced Practice Providers on your designated Care Team:    Tereso Newcomer, PA-C  Vin Arthur, [place]  Berton Bon, [place]       Signed, Kristeen Miss, MD  04/11/2019 2:12 PM    [facility]

## 2019-04-11 NOTE — Patient Instructions (Signed)
Medication Instructions:  Your physician recommends that you continue on your current medications as directed. Please refer to the Current Medication list given to you today.  *If you need a refill on your cardiac medications before your next appointment, please call your pharmacy*   Lab Work: None Ordered   Testing/Procedures: None Ordered   Follow-Up: At [facility], you and your health needs are our priority.  As part of our continuing mission to provide you with exceptional heart care, we have created designated Provider Care Teams.  These Care Teams include your primary Cardiologist (physician) and Advanced Practice Providers (APPs -  Physician Assistants and Nurse Practitioners) who all work together to provide you with the care you need, when you need it.  Your next appointment:   6 month(s)  The format for your next appointment:   Either In Person or Virtual  Provider:   You may see Philip Nahser, MD or one of the following Advanced Practice Providers on your designated Care Team:    Scott Weaver, PA-C  Vin Bhagat, PA-C  Janine Hammond, NP    

## 2019-04-22 ENCOUNTER — Other Ambulatory Visit (HOSPITAL_COMMUNITY)
Admission: RE | Admit: 2019-04-22 | Discharge: 2019-04-22 | Disposition: A | Discharge status: Home / Self Care | Payer: 59 | Source: Ambulatory Visit | Attending: Cardiology | Admitting: Cardiology

## 2019-04-22 DIAGNOSIS — Z20822 Contact with and (suspected) exposure to covid-19: Secondary | ICD-10-CM | POA: Insufficient documentation

## 2019-04-22 DIAGNOSIS — Z01812 Encounter for preprocedural laboratory examination: Secondary | ICD-10-CM | POA: Insufficient documentation

## 2019-04-22 LAB — SARS CORONAVIRUS 2 (TAT 6-24 HRS): SARS Coronavirus 2: NEGATIVE

## 2019-04-24 ENCOUNTER — Ambulatory Visit (HOSPITAL_BASED_OUTPATIENT_CLINIC_OR_DEPARTMENT_OTHER): Payer: PRIVATE HEALTH INSURANCE | Attending: Cardiovascular Disease | Admitting: Cardiology

## 2019-04-24 ENCOUNTER — Other Ambulatory Visit: Payer: Self-pay

## 2019-04-24 DIAGNOSIS — I10 Essential (primary) hypertension: Secondary | ICD-10-CM | POA: Diagnosis not present

## 2019-04-24 DIAGNOSIS — G4730 Sleep apnea, unspecified: Secondary | ICD-10-CM | POA: Diagnosis present

## 2019-04-24 DIAGNOSIS — G4733 Obstructive sleep apnea (adult) (pediatric): Secondary | ICD-10-CM | POA: Insufficient documentation

## 2019-04-24 DIAGNOSIS — I4891 Unspecified atrial fibrillation: Secondary | ICD-10-CM | POA: Diagnosis not present

## 2019-04-25 ENCOUNTER — Telehealth: Payer: Self-pay | Admitting: *Deleted

## 2019-04-25 NOTE — Procedures (Signed)
   Patient Name: Phillip Raymond, Phillip Raymond Date: 04/24/2019 Gender: Male D.O.B: 17-Feb-1961 Age (years): 58 Referring Provider: Kristeen Miss Height (inches): 70 Interpreting Physician: Armanda Magic MD, ABSM Weight (lbs): 245 RPSGT: Cherylann Parr BMI: 35 MRN: [id number] Neck Size: 17.00  CLINICAL INFORMATION Sleep Study Type: NPSG  Indication for sleep study: Fatigue, Hypertension, Snoring, Witnesses Apnea / Gasping During Sleep  Epworth Sleepiness Score: 13  SLEEP STUDY TECHNIQUE As per the AASM Manual for the Scoring of Sleep and Associated Events v2.3 (April 2016) with a hypopnea requiring 4% desaturations.  The channels recorded and monitored were frontal, central and occipital EEG, electrooculogram (EOG), submentalis EMG (chin), nasal and oral airflow, thoracic and abdominal wall motion, anterior tibialis EMG, snore microphone, electrocardiogram, and pulse oximetry.  MEDICATIONS Medications self-administered by patient taken the night of the study : N/A  SLEEP ARCHITECTURE The study was initiated at 10:08:28 PM and ended at 5:18:17 AM.  Sleep onset time was 52.9 minutes and the sleep efficiency was 79.5%. The total sleep time was 341.5 minutes.  Stage REM latency was 69.5 minutes.  The patient spent 4.4% of the night in stage N1 sleep, 69.0% in stage N2 sleep, 0.0% in stage N3 and 26.7% in REM.  Alpha intrusion was absent.  Supine sleep was 38.83%.  RESPIRATORY PARAMETERS The overall apnea/hypopnea index (AHI) was 9.0 per hour. There were 47 total apneas, including 43 obstructive, 4 central and 0 mixed apneas. There were 4 hypopneas and 33 RERAs.  The AHI during Stage REM sleep was 12.5 per hour.  AHI while supine was 20.4 per hour.  The mean oxygen saturation was 94.0%. The minimum SpO2 during sleep was 89.0%.  loud snoring was noted during this study.  CARDIAC DATA The 2 lead EKG demonstrated sinus rhythm. The mean heart rate was 52.2 beats per minute. Other EKG  findings include: PACs  LEG MOVEMENT DATA The total PLMS were 0 with a resulting PLMS index of 0.0. Associated arousal with leg movement index was 0.0 .  IMPRESSIONS - Mild obstructive sleep apnea occurred during this study (AHI = 9.0/h). - No significant central sleep apnea occurred during this study (CAI = 0.7/h). - The patient had minimal or no oxygen desaturation during the study (Min O2 = 89.0%) - The patient snored with loud snoring volume. - EKG findings include PACs - Clinically significant periodic limb movements did not occur during sleep. No significant associated arousals.  DIAGNOSIS - Obstructive Sleep Apnea (327.23 [G47.33 ICD-10])  RECOMMENDATIONS - Therapeutic CPAP titration to determine optimal pressure required to alleviate sleep disordered breathing. - Positional therapy avoiding supine position during sleep. - Avoid alcohol, sedatives and other CNS depressants that may worsen sleep apnea and disrupt normal sleep architecture. - Sleep hygiene should be reviewed to assess factors that may improve sleep quality. - Weight management and regular exercise should be initiated or continued if appropriate.  [Electronically signed] 04/25/2019 10:05 AM  Armanda Magic MD, ABSM Diplomate, American Board of Sleep Medicine

## 2019-04-25 NOTE — Telephone Encounter (Signed)
-----   Message from Quintella Reichert, MD sent at 04/25/2019 10:08 AM EST ----- Please let patient know that they have sleep apnea and recommend CPAP titration. Please set up titration in the sleep lab.

## 2019-04-25 NOTE — Telephone Encounter (Signed)
Informed patient of sleep study results and patient understanding was verbalized. Patient understands his sleep study showed they have sleep apnea and recommend CPAP titration. Please set up titration in the sleep lab.  Pt is aware and agreeable to his results.  Titration sent to sleep pool   

## 2019-05-05 ENCOUNTER — Telehealth: Payer: Self-pay | Admitting: *Deleted

## 2019-05-05 NOTE — Telephone Encounter (Signed)
Staff message sent to Phillip Raymond per Arnise P @ [employer] no PA is required for sleep studies. Ok to schedule.

## 2019-05-05 NOTE — Telephone Encounter (Signed)
-----   Message from Reesa Chew, CMA sent at 04/25/2019  5:48 PM EST ----- Regarding: precert recommend CPAP titration

## 2019-05-06 ENCOUNTER — Telehealth: Payer: Self-pay | Admitting: *Deleted

## 2019-05-06 DIAGNOSIS — G4730 Sleep apnea, unspecified: Secondary | ICD-10-CM

## 2019-05-06 NOTE — Telephone Encounter (Addendum)
Patient is scheduled for lab study on 05/29/19. Patient is scheduled for COVID screening on 05/27/19 9 am prior to titration.  Patient understands his sleep study will be done at [facility] sleep lab. Patient understands he will receive a sleep packet in a week or so. Patient understands to call if he does not receive the sleep packet in a timely manner. Patient agrees with treatment and thanked me for call.

## 2019-05-06 NOTE — Telephone Encounter (Signed)
-----   Message from Gaynelle Cage, CMA sent at 05/05/2019 11:36 AM EST ----- Regarding: RE: precert Per  Arnise P @ [employer] no PA is required for sleep study. Ok to schedule. ----- Message ----- From: Reesa Chew, CMA Sent: 04/25/2019   5:48 PM EST To: Cv Div Sleep Studies Subject: precert                                        recommend CPAP titration

## 2019-05-19 ENCOUNTER — Other Ambulatory Visit (HOSPITAL_COMMUNITY): Payer: PRIVATE HEALTH INSURANCE

## 2019-05-21 ENCOUNTER — Encounter (HOSPITAL_BASED_OUTPATIENT_CLINIC_OR_DEPARTMENT_OTHER): Payer: PRIVATE HEALTH INSURANCE | Admitting: Cardiology

## 2019-05-27 ENCOUNTER — Other Ambulatory Visit (HOSPITAL_COMMUNITY)
Admission: RE | Admit: 2019-05-27 | Discharge: 2019-05-27 | Disposition: A | Discharge status: Home / Self Care | Payer: PRIVATE HEALTH INSURANCE | Source: Ambulatory Visit | Attending: Cardiology | Admitting: Cardiology

## 2019-05-27 DIAGNOSIS — Z20822 Contact with and (suspected) exposure to covid-19: Secondary | ICD-10-CM | POA: Diagnosis not present

## 2019-05-27 DIAGNOSIS — Z01812 Encounter for preprocedural laboratory examination: Secondary | ICD-10-CM | POA: Insufficient documentation

## 2019-05-27 LAB — SARS CORONAVIRUS 2 (TAT 6-24 HRS): SARS Coronavirus 2: NEGATIVE

## 2019-05-29 ENCOUNTER — Other Ambulatory Visit: Payer: Self-pay

## 2019-05-29 ENCOUNTER — Ambulatory Visit (HOSPITAL_BASED_OUTPATIENT_CLINIC_OR_DEPARTMENT_OTHER): Payer: 59 | Attending: Cardiovascular Disease | Admitting: Cardiology

## 2019-05-29 DIAGNOSIS — G4730 Sleep apnea, unspecified: Secondary | ICD-10-CM | POA: Diagnosis present

## 2019-05-29 DIAGNOSIS — Z79899 Other long term (current) drug therapy: Secondary | ICD-10-CM | POA: Insufficient documentation

## 2019-05-29 DIAGNOSIS — G4733 Obstructive sleep apnea (adult) (pediatric): Secondary | ICD-10-CM | POA: Insufficient documentation

## 2019-06-01 NOTE — Procedures (Signed)
    Patient Name: Phillip Raymond, Phillip Raymond Date: 05/29/2019 Gender: Male D.O.B: July 08, 1960 Age (years): 58 Referring Provider: Kristeen Miss Height (inches): 70 Interpreting Physician: Armanda Magic MD, ABSM Weight (lbs): 245 RPSGT: Lowry Ram BMI: 35 MRN: [id number] Neck Size: 17.00  CLINICAL INFORMATION The patient is referred for a BiPAP titration to treat sleep apnea.  SLEEP STUDY TECHNIQUE As per the AASM Manual for the Scoring of Sleep and Associated Events v2.3 (April 2016) with a hypopnea requiring 4% desaturations.  The channels recorded and monitored were frontal, central and occipital EEG, electrooculogram (EOG), submentalis EMG (chin), nasal and oral airflow, thoracic and abdominal wall motion, anterior tibialis EMG, snore microphone, electrocardiogram, and pulse oximetry. Bilevel positive airway pressure (BPAP) was initiated at the beginning of the study and titrated to treat sleep-disordered breathing.  MEDICATIONS Medications self-administered by patient taken the night of the study : AMBIEN, TYLENOL EXTRA STRENGTH  RESPIRATORY PARAMETERS Optimal IPAP Pressure (cm): N/A  AHI at Optimal Pressure (/hr) N/A Optimal EPAP Pressure (cm):N/A  Overall Minimal O2 (%):92.0  Minimal O2 at Optimal Pressure (%): N/A  SLEEP ARCHITECTURE Start Time:10:28:37 PM  Stop Time:5:10:01 AM  Total Time (min):401.4  Total Sleep Time (min):310.5 Sleep Latency (min):1.2  Sleep Efficiency (%):77.4%  REM Latency (min):152.5  WASO (min):89.7 Stage N1 (%):14.3%  Stage N2 (%): 74.2%  Stage N3 (%): 1.0%  Stage R (%): 10.5 Supine (%):73.27  Arousal Index (/hr):35.7   CARDIAC DATA The 2 lead EKG demonstrated sinus rhythm. The mean heart rate was 51.0 beats per minute. Other EKG findings include: None.  LEG MOVEMENT DATA The total Periodic Limb Movements of Sleep (PLMS) were 0. The PLMS index was 0.0. A PLMS index of <15 is considered normal in adults.  IMPRESSIONS - An optimal  PAP pressure could not be selected for this patient based on the available study data. - Severe Central Sleep Apnea was noted during this titration (CAI = 44.4/h). - Significant oxygen desaturations were not observed during this titration (min O2 = 92.0%). - The patient snored with soft snoring volume. - No cardiac abnormalities were observed during this study. - Clinically significant periodic limb movements were not noted during this study. Arousals associated with PLMs were rare.  DIAGNOSIS - Obstructive Sleep Apnea (327.23 [G47.33 ICD-10])  RECOMMENDATIONS - Recomend repeat in lab BiPAP titration.  - Avoid alcohol, sedatives and other CNS depressants that may worsen sleep apnea and disrupt normal sleep architecture. - Sleep hygiene should be reviewed to assess factors that may improve sleep quality. - Weight management and regular exercise should be initiated or continued.  [Electronically signed] 06/01/2019 03:11 PM  Armanda Magic MD, ABSM Diplomate, American Board of Sleep Medicine

## 2019-06-05 ENCOUNTER — Telehealth: Payer: Self-pay | Admitting: *Deleted

## 2019-06-05 DIAGNOSIS — G4730 Sleep apnea, unspecified: Secondary | ICD-10-CM

## 2019-06-05 NOTE — Telephone Encounter (Signed)
-----   Message from Quintella Reichert, MD sent at 06/01/2019  3:15 PM EST ----- Please let patient know that he had unsuccessful CPAP titration due to severity of OSA and please set up repeat BiPAP titration I lab

## 2019-06-05 NOTE — Telephone Encounter (Signed)
Informed patient of sleep study results and patient understanding was verbalized. Patient understands his sleep study showed he had unsuccessful CPAP titration due to severity of OSA and please set up repeat BiPAP titration in lab. Pt is aware and agreeable to his results. Bipap titration sent to precert

## 2019-06-06 ENCOUNTER — Telehealth: Payer: Self-pay | Admitting: *Deleted

## 2019-06-06 NOTE — Telephone Encounter (Signed)
Staff message sent to [place] [place] ok to schedule titration study. Per patient's insurance no PA is required for sleep studies.

## 2019-06-09 NOTE — Addendum Note (Signed)
Addended by: Reesa Chew on: 06/09/2019 03:06 PM   Modules accepted: Orders

## 2019-06-10 ENCOUNTER — Telehealth: Payer: Self-pay | Admitting: *Deleted

## 2019-06-10 NOTE — Telephone Encounter (Signed)
Patient is scheduled for BIPAP Titration on 06/17/19. PT is scheduled for COVID screening on 06/14/26 9:00 prior to titration.  Patient understands his BIPAP titration study will be done at [facility] sleep lab. Patient understands he will receive a letter in a week or so detailing appointment, date, time, and location. Patient understands to call if he does not receive the letter  in a timely manner. Patient agrees with treatment and thanked me for call.

## 2019-06-10 NOTE — Telephone Encounter (Signed)
-----   Message from Gaynelle Cage, CMA sent at 06/06/2019  9:50 AM EST ----- Regarding: RE: precert Ok to schedule. Per patient's insurance no PA is required. ----- Message ----- From: Reesa Chew, CMA Sent: 06/05/2019   5:12 PM EST To: Cv Div Sleep Studies Subject: precert                                        BiPAP titration in lab.

## 2019-06-14 ENCOUNTER — Other Ambulatory Visit (HOSPITAL_COMMUNITY): Payer: PRIVATE HEALTH INSURANCE

## 2019-06-17 ENCOUNTER — Ambulatory Visit: Payer: 59 | Attending: Cardiology | Admitting: Cardiology

## 2019-06-17 ENCOUNTER — Other Ambulatory Visit: Payer: Self-pay

## 2019-06-17 DIAGNOSIS — G4730 Sleep apnea, unspecified: Secondary | ICD-10-CM

## 2019-06-17 DIAGNOSIS — G4733 Obstructive sleep apnea (adult) (pediatric): Secondary | ICD-10-CM | POA: Insufficient documentation

## 2019-06-20 ENCOUNTER — Telehealth: Payer: Self-pay | Admitting: *Deleted

## 2019-06-20 NOTE — Telephone Encounter (Signed)
-----   Message from Quintella Reichert, MD sent at 06/20/2019  2:01 PM EDT ----- Please let patient know that they had a successful PAP titration and let DME know that orders are in EPIC.  Please set up 8 week OV with me.

## 2019-06-20 NOTE — Procedures (Signed)
   Patient Name: Phillip Raymond, Phillip Raymond Date: 06/17/2019 Gender: Male D.O.B: [birth year] Age (years): 89 Referring Provider: Armanda Magic MD, ABSM Height (inches): 72 Interpreting Physician: Armanda Magic MD, ABSM Weight (lbs): 250 RPSGT: Alfonso Ellis BMI: 34 MRN: [id number] Neck Size: 17.00  CLINICAL INFORMATION The patient is referred for a BiPAP titration to treat sleep apnea.  SLEEP STUDY TECHNIQUE As per the AASM Manual for the Scoring of Sleep and Associated Events v2.3 (April 2016) with a hypopnea requiring 4% desaturations.  The channels recorded and monitored were frontal, central and occipital EEG, electrooculogram (EOG), submentalis EMG (chin), nasal and oral airflow, thoracic and abdominal wall motion, anterior tibialis EMG, snore microphone, electrocardiogram, and pulse oximetry. Bilevel positive airway pressure (BPAP) was initiated at the beginning of the study and titrated to treat sleep-disordered breathing.  MEDICATIONS Medications self-administered by patient taken the night of the study : AMBIEN, TYLENOL EXTRA STRENGTH  RESPIRATORY PARAMETERS Optimal IPAP Pressure (cm): N/A  AHI at Optimal Pressure (/hr) N/A Optimal EPAP Pressure (cm):N/A  Overall Minimal O2 (%):86.0  Minimal O2 at Optimal Pressure (%): 86.0  SLEEP ARCHITECTURE Start Time:10:27:06 PM  Stop Time5:05:39 AM  Total Time (min):398.6  Total Sleep Time (min):292.5 Sleep Latency (min):1.7  Sleep Efficiency (%):73.4%  REM Latency (min):81.5  WASO (min):104.3 Stage N1 (%):14.4%  Stage N2 (%): 51.6%  Stage N3 (%): 23.2%  Stage R (%):10.8 Supine (%):64.59  Arousal Index (/hr):23.6   CARDIAC DATA The 2 lead EKG demonstrated sinus rhythm. The mean heart rate was 51.5 beats per minute. Other EKG findings include: None.  LEG MOVEMENT DATA The total Periodic Limb Movements of Sleep (PLMS) were 0. The PLMS index was 0.0. A PLMS index of <15 is considered normal in adults.  IMPRESSIONS - An  optimal PAP pressure could not be selected for this patient based on the available study data. - Severe Central Sleep Apnea was noted during this titration (CAI = 50.5/h). - Moderete oxygen desaturations were observed during this titration (min O2 = 86.0%). - The patient snored with moderate snoring volume. - No cardiac abnormalities were observed during this study. - Clinically significant periodic limb movements were not noted during this study. Arousals associated with PLMs were rare.  DIAGNOSIS - Obstructive Sleep Apnea (327.23 [G47.33 ICD-10])  RECOMMENDATIONS - Recommend a trial of Auto-BiPAP with IPAP max 18cm H2O, EPAP min 6cm H20 and PS 4cm H2O, heated humidity and mask of choice. - Avoid alcohol, sedatives and other CNS depressants that may worsen sleep apnea and disrupt normal sleep architecture. - Sleep hygiene should be reviewed to assess factors that may improve sleep quality. - Weight management and regular exercise should be initiated or continued. - Return to Sleep Center for re-evaluation after 8 weeks of therapy  [Electronically signed] 06/20/2019 01:57 PM  Armanda Magic MD, ABSM Diplomate, American Board of Sleep Medicine

## 2019-06-20 NOTE — Telephone Encounter (Addendum)
Informed patient of sleep study results and patient understanding was verbalized. Patient understands his sleep study showed they had a successful PAP titration and let DME know that orders are in EPIC. Please set up 8 week OV with me.  Pt is aware and agreeable to normal results.  Upon patient request DME selection is [facility]. Patient understands he will be contacted by [facility] to set up his cpap. Patient understands to call if [facility] does not contact him with new setup in a timely manner. Patient understands they will be called once confirmation has been received from [facility] that they have received their new machine to schedule 10 week follow up appointment.  [facility] notified of new cpap order  Please add to airview Patient was grateful for the call and thanked me.

## 2019-06-27 ENCOUNTER — Other Ambulatory Visit (HOSPITAL_COMMUNITY): Payer: PRIVATE HEALTH INSURANCE

## 2019-06-29 ENCOUNTER — Encounter (HOSPITAL_BASED_OUTPATIENT_CLINIC_OR_DEPARTMENT_OTHER): Payer: PRIVATE HEALTH INSURANCE | Admitting: Cardiology

## 2019-07-03 ENCOUNTER — Telehealth: Payer: Self-pay | Admitting: *Deleted

## 2019-07-03 NOTE — Telephone Encounter (Signed)
YOUR CARDIOLOGY TEAM HAS ARRANGED FOR AN E-VISIT FOR YOUR APPOINTMENT - PLEASE REVIEW IMPORTANT INFORMATION BELOW SEVERAL DAYS PRIOR TO YOUR APPOINTMENT  Due to the recent COVID-19 pandemic, we are transitioning in-person office visits to tele-medicine visits in an effort to decrease unnecessary exposure to our patients, their families, and staff. These visits are billed to your insurance just like a normal visit is. We also encourage you to sign up for MyChart if you have not already done so. You will need a smartphone if possible. For patients that do not have this, we can still complete the visit using a regular telephone but do prefer a smartphone to enable video when possible. You may have a family member that lives with you that can help. If possible, we also ask that you have a blood pressure cuff and scale at home to measure your blood pressure, heart rate and weight prior to your scheduled appointment. Patients with clinical needs that need an in-person evaluation and testing will still be able to come to the office if absolutely necessary. If you have any questions, feel free to call our office.     YOUR PROVIDER WILL BE USING THE FOLLOWING PLATFORM TO COMPLETE YOUR VISIT:   . IF USING MYCHART - How to Download the MyChart App to Your SmartPhone   - If Apple, go to App Store and type in MyChart in the search bar and download the app. If Android, ask patient to go to Google Play Store and type in MyChart in the search bar and download the app. The app is free but as with any other app downloads, your phone may require you to verify saved payment information or Apple/Android password.  - You will need to then log into the app with your MyChart username and password, and select [facility] as your healthcare provider to link the account.  - When it is time for your visit, go to the MyChart app, find appointments, and click Begin Video Visit. Be sure to Select Allow for your device to access the  Microphone and Camera for your visit. You will then be connected, and your provider will be with you shortly.  **If you have any issues connecting or need assistance, please contact MyChart service desk [phone] ([phone])**  **If using a computer, in order to ensure the best quality for your visit, you will need to use either of the following Internet Browsers: Google Chrome or Microsoft Edge**  . IF USING DOXIMITY or [email or web address].ME - The staff will give you instructions on receiving your link to join the meeting the day of your visit.      THE DAY OF YOUR APPOINTMENT  Approximately 15 minutes prior to your scheduled appointment, you will receive a telephone call from one of [facility] team - your caller ID may say "Unknown caller."  Our staff will confirm medications, vital signs for the day and any symptoms you may be experiencing. Please have this information available prior to the time of visit start. It may also be helpful for you to have a pad of paper and pen handy for any instructions given during your visit. They will also walk you through joining the smartphone meeting if this is a video visit.    CONSENT FOR TELE-HEALTH VISIT - PLEASE REVIEW  I hereby voluntarily request, consent and authorize [facility] and its employed or contracted physicians, physician assistants, nurse practitioners or other licensed health care professionals (the Practitioner), to provide me with telemedicine health care   services (the "Services") as deemed necessary by the treating Practitioner. I acknowledge and consent to receive the Services by the Practitioner via telemedicine. I understand that the telemedicine visit will involve communicating with the Practitioner through live audiovisual communication technology and the disclosure of certain medical information by electronic transmission. I acknowledge that I have been given the opportunity to request an in-person assessment or other available  alternative prior to the telemedicine visit and am voluntarily participating in the telemedicine visit.  I understand that I have the right to withhold or withdraw my consent to the use of telemedicine in the course of my care at any time, without affecting my right to future care or treatment, and that the Practitioner or I may terminate the telemedicine visit at any time. I understand that I have the right to inspect all information obtained and/or recorded in the course of the telemedicine visit and may receive copies of available information for a reasonable fee.  I understand that some of the potential risks of receiving the Services via telemedicine include:  . Delay or interruption in medical evaluation due to technological equipment failure or disruption; . Information transmitted may not be sufficient (e.g. poor resolution of images) to allow for appropriate medical decision making by the Practitioner; and/or  . In rare instances, security protocols could fail, causing a breach of personal health information.  Furthermore, I acknowledge that it is my responsibility to provide information about my medical history, conditions and care that is complete and accurate to the best of my ability. I acknowledge that Practitioner's advice, recommendations, and/or decision may be based on factors not within their control, such as incomplete or inaccurate data provided by me or distortions of diagnostic images or specimens that may result from electronic transmissions. I understand that the practice of medicine is not an exact science and that Practitioner makes no warranties or guarantees regarding treatment outcomes. I acknowledge that I will receive a copy of this consent concurrently upon execution via email to the email address I last provided but may also request a printed copy by calling the office of [facility].    I understand that my insurance will be billed for this visit.   I have read or had  this consent read to me. . I understand the contents of this consent, which adequately explains the benefits and risks of the Services being provided via telemedicine.  . I have been provided ample opportunity to ask questions regarding this consent and the Services and have had my questions answered to my satisfaction. . I give my informed consent for the services to be provided through the use of telemedicine in my medical care  By participating in this telemedicine visit I agree to the above. 

## 2019-07-31 NOTE — Progress Notes (Signed)
Virtual Visit via Video Note   This visit type was conducted due to national recommendations for restrictions regarding the COVID-19 Pandemic (e.g. social distancing) in an effort to limit this patient's exposure and mitigate transmission in our community.  Due to his co-morbid illnesses, this patient is at least at moderate risk for complications without adequate follow up.  This format is felt to be most appropriate for this patient at this time.  The patient did not have access to video technology/had technical difficulties with video requiring transitioning to audio format only (telephone).  All issues noted in this document were discussed and addressed.  No physical exam could be performed with this format.  Please refer to the patient's chart for his  consent to telehealth for [facility].   Evaluation Performed:  Follow-up visit  This visit type was conducted due to national recommendations for restrictions regarding the COVID-19 Pandemic (e.g. social distancing).  This format is felt to be most appropriate for this patient at this time.  All issues noted in this document were discussed and addressed.  No physical exam was performed (except for noted visual exam findings with Video Visits).  Please refer to the patient's chart (MyChart message for video visits and phone note for telephone visits) for the patient's consent to telehealth for [facility].  Date:  08/01/2019   ID:  Phillip Raymond, DOB May 29, 1960, MRN [id number]  Patient Location:  Home  Provider location:   [place]  PCP:  Katherine Basset, PA-C  Cardiologist:  Kristeen Miss, MD  Sleep Medicine;  Armanda Magic, MD Electrophysiologist:  None   Chief Complaint:  OSA  History of Present Illness:    Phillip Raymond is a 59 y.o. male who presents via audio/video conferencing for a telehealth visit today.    This is a 59yo male with a hx of PAF who was referred by Dr. Elease Hashimoto for sleep study due to atrial  fibrillation.  He underwent PSG showing mild OSA with an AHI of 9/hr and minimal O2 desats to 89%.  He says that he would feel tired during the day. Due to elevated Epworth sleepiness score of 13 and PAF CPAP was recommended and he underwent CPAP titration but due to ongoing events he was changed to auto BiPAP.    He is doing well with his CPAP device and thinks that he has gotten used to it.  He tolerates the mask and feels the pressure is adequate.  Since going on CPAP he feels more rested in the am but still has some daytime sleepiness in the am that he thinks it may be related to the Zolpidem.  He denies any significant mouth or nasal dryness or nasal congestion.  He does not think that he snores.    The patient does not have symptoms concerning for COVID-19 infection (fever, chills, cough, or new shortness of breath).    Prior CV studies:   The following studies were reviewed today:  PSG, CPAP titration, BiPAP titration, PAP compliance download  Past Medical History:  Diagnosis Date  . A-fib (HCC)   . Colon polyp   . Insomnia   . Palpitations   . SOB (shortness of breath)    Past Surgical History:  Procedure Laterality Date  . HERNIA REPAIR    . SINUS VENOSUS CLOSURE       Current Meds  Medication Sig  . apixaban (ELIQUIS) 5 MG TABS tablet Take 1 tablet (5 mg total) by mouth 2 (two) times daily.  Marland Kitchen  Ascorbic Acid (VITAMIN C) 1000 MG tablet Take 1,000 mg by mouth 2 (two) times daily.   Marland Kitchen CARTIA XT 120 MG 24 hr capsule Take 1 capsule (120 mg total) by mouth daily.  . cetirizine (ZYRTEC) 10 MG tablet Take 10 mg by mouth daily.  . ferrous sulfate 325 (65 FE) MG tablet Take 325 mg by mouth 2 (two) times daily with a meal.   . Turmeric 450 MG CAPS Take 450 mg by mouth 2 (two) times daily.   . vitamin B-12 (CYANOCOBALAMIN) 1000 MCG tablet Take 1,000 mcg by mouth daily.   Marland Kitchen zolpidem (AMBIEN) 10 MG tablet Take 10 mg by mouth at bedtime.     Allergies:   Patient has no known  allergies.   Social History   Tobacco Use  . Smoking status: Never Smoker  . Smokeless tobacco: Never Used  Substance Use Topics  . Alcohol use: Yes  . Drug use: Never     Family Hx: The patient's family history includes Cancer - Colon in his father; Diabetes in his father.  ROS:   Please see the history of present illness.     All other systems reviewed and are negative.   Labs/Other Tests and Data Reviewed:    Recent Labs: 04/04/2019: BUN 11; Creatinine, Ser 0.73; Hemoglobin 14.7; Magnesium 2.0; Platelets 196; Potassium 3.8; Sodium 139   Recent Lipid Panel No results found for: CHOL, TRIG, HDL, CHOLHDL, LDLCALC, LDLDIRECT  Wt Readings from Last 3 Encounters:  08/01/19 250 lb (113.4 kg)  05/29/19 250 lb (113.4 kg)  04/24/19 245 lb (111.1 kg)     Objective:    Vital Signs:  Ht 6\' 1"  (1.854 m)   Wt 250 lb (113.4 kg)   BMI 32.98 kg/m    CONSTITUTIONAL:  Well nourished, well developed male in no acute distress.  EYES: anicteric MOUTH: oral mucosa is pink RESPIRATORY: Normal respiratory effort, symmetric expansion CARDIOVASCULAR: No peripheral edema SKIN: No rash, lesions or ulcers MUSCULOSKELETAL: no digital cyanosis NEURO: Cranial Nerves II-XII grossly intact, moves all extremities PSYCH: Intact judgement and insight.  A&O x 3, Mood/affect appropriate   ASSESSMENT & PLAN:   17.6 1.  OSA - The pathophysiology of obstructive sleep apnea , it's cardiovascular consequences & modes of treatment including CPAP were discused with the patient in detail & they evidenced understanding.  The patient is tolerating PAP therapy well without any problems. The PAP download was reviewed today and showed an AHI of 17.6/hr on auto BIPAP cm H2O with 90% compliance in using more than 4 hours nightly.  The patient has been using and benefiting from PAP use and will continue to benefit from therapy.  -I have encouraged him to avoid sleeping on his back -I will change BiPAP auto to IPAP  max of 22cm H2O, EPAP min of 8cm H2O and PS 5cm H2O -I am going to have him see his DME to check his mask as it appears to be leaking some  2.  Obesity -I have encouraged him to get into a routine exercise program and cut back on carbs and portions.   COVID-19 Education: The signs and symptoms of COVID-19 were discussed with the patient and how to seek care for testing (follow up with PCP or arrange E-visit).  The importance of social distancing was discussed today.  Patient Risk:   After full review of this patient's clinical status, I feel that they are at least moderate risk at this time.  Time:   Today,  I have spent 20 minutes on telemedicine discussing medical problems including OSA, obesity and reviewing patient's chart including sleep study, CPAP titration and BiPAP titration, PAP compliance download.  Medication Adjustments/Labs and Tests Ordered: Current medicines are reviewed at length with the patient today.  Concerns regarding medicines are outlined above.  Tests Ordered: No orders of the defined types were placed in this encounter.  Medication Changes: No orders of the defined types were placed in this encounter.   Disposition:  Follow up in 1 year(s)  Signed, Armanda Magic, MD  08/01/2019 8:16 AM    [facility]

## 2019-08-01 ENCOUNTER — Other Ambulatory Visit: Payer: Self-pay

## 2019-08-01 ENCOUNTER — Telehealth (INDEPENDENT_AMBULATORY_CARE_PROVIDER_SITE_OTHER): Payer: PRIVATE HEALTH INSURANCE | Admitting: Cardiology

## 2019-08-01 ENCOUNTER — Telehealth: Payer: Self-pay | Admitting: *Deleted

## 2019-08-01 ENCOUNTER — Encounter: Payer: Self-pay | Admitting: Cardiology

## 2019-08-01 VITALS — Ht 73.0 in | Wt 250.0 lb

## 2019-08-01 DIAGNOSIS — G4733 Obstructive sleep apnea (adult) (pediatric): Secondary | ICD-10-CM

## 2019-08-01 DIAGNOSIS — E669 Obesity, unspecified: Secondary | ICD-10-CM | POA: Diagnosis not present

## 2019-08-01 NOTE — Patient Instructions (Signed)
Medication Instructions:  Your physician recommends that you continue on your current medications as directed. Please refer to the Current Medication list given to you today.  *If you need a refill on your cardiac medications before your next appointment, please call your pharmacy*   Lab Work: none If you have labs (blood work) drawn today and your tests are completely normal, you will receive your results only by: . MyChart Message (if you have MyChart) OR . A paper copy in the mail If you have any lab test that is abnormal or we need to change your treatment, we will call you to review the results.   Testing/Procedures: none   Follow-Up: At [facility], you and your health needs are our priority.  As part of our continuing mission to provide you with exceptional heart care, we have created designated Provider Care Teams.  These Care Teams include your primary Cardiologist (physician) and Advanced Practice Providers (APPs -  Physician Assistants and Nurse Practitioners) who all work together to provide you with the care you need, when you need it.  We recommend signing up for the patient portal called "MyChart".  Sign up information is provided on this After Visit Summary.  MyChart is used to connect with patients for Virtual Visits (Telemedicine).  Patients are able to view lab/test results, encounter notes, upcoming appointments, etc.  Non-urgent messages can be sent to your provider as well.   To learn more about what you can do with MyChart, go to [email or web address].    Your next appointment:   1 year(s)  The format for your next appointment:   Virtual Visit   Provider:   Traci Turner, MD   Other Instructions   

## 2019-08-01 NOTE — Telephone Encounter (Signed)
Please call his insurance and find out why they are not covering the BiPAP - they likely do not have the right ICD code = apparently the sleep study was not covered either  [employer] looked in patients chart and it looks like the insurance is not paying due to non compliance and low usage but they advised me to tell the patient to call the billing department to be sure. His download (07/02/19/ - 07/31/19 shows over 6 hours usage, and 30/30 days 100%.    Please change BiPAP auto to IPAP max of 22cm H2O, EPAP min of 8cm H2O and PS 5cm H2O and get a download in 2 weeks. He needs to see DME to check on mask leak.  Please let him know that his AHI is still too high. Needs to stay off back when sleeping     Order placed to [employer] via community message for pressure change and to check on his mask leak. Patient notified that his AHI is too high at 17.6 and he needs to stay off his back when sleeping.

## 2019-08-26 ENCOUNTER — Telehealth (HOSPITAL_COMMUNITY): Payer: Self-pay

## 2019-08-26 ENCOUNTER — Emergency Department (HOSPITAL_COMMUNITY): Payer: 59

## 2019-08-26 ENCOUNTER — Emergency Department (HOSPITAL_COMMUNITY)
Admission: EM | Admit: 2019-08-26 | Discharge: 2019-08-26 | Disposition: A | Discharge status: Home / Self Care | Payer: 59 | Attending: Emergency Medicine | Admitting: Emergency Medicine

## 2019-08-26 ENCOUNTER — Encounter (HOSPITAL_COMMUNITY): Payer: Self-pay | Admitting: *Deleted

## 2019-08-26 ENCOUNTER — Other Ambulatory Visit: Payer: Self-pay

## 2019-08-26 DIAGNOSIS — Z7901 Long term (current) use of anticoagulants: Secondary | ICD-10-CM | POA: Insufficient documentation

## 2019-08-26 DIAGNOSIS — I4891 Unspecified atrial fibrillation: Secondary | ICD-10-CM

## 2019-08-26 DIAGNOSIS — Z79899 Other long term (current) drug therapy: Secondary | ICD-10-CM | POA: Diagnosis not present

## 2019-08-26 DIAGNOSIS — R002 Palpitations: Secondary | ICD-10-CM | POA: Diagnosis present

## 2019-08-26 LAB — BASIC METABOLIC PANEL
Anion gap: 7 (ref 5–15)
BUN: 11 mg/dL (ref 6–20)
CO2: 28 mmol/L (ref 22–32)
Calcium: 9.8 mg/dL (ref 8.9–10.3)
Chloride: 105 mmol/L (ref 98–111)
Creatinine, Ser: 0.84 mg/dL (ref 0.61–1.24)
GFR calc Af Amer: >60 mL/min (ref >60)
GFR calc non Af Amer: >60 mL/min (ref >60)
Glucose, Bld: 99 mg/dL (ref 70–99)
Potassium: 4 mmol/L (ref 3.5–5.1)
Sodium: 140 mmol/L (ref 135–145)

## 2019-08-26 LAB — CBC
HCT: 47.3 % (ref 39.0–52.0)
Hemoglobin: 15.3 g/dL (ref 13.0–17.0)
MCH: 30.2 pg (ref 26.0–34.0)
MCHC: 32.3 g/dL (ref 30.0–36.0)
MCV: 93.5 fL (ref 80.0–100.0)
Platelets: 199 10*3/uL (ref 150–400)
RBC: 5.06 MIL/uL (ref 4.22–5.81)
RDW: 12.6 % (ref 11.5–15.5)
WBC: 6.4 10*3/uL (ref 4.0–10.5)
nRBC: 0 % (ref 0.0–0.2)

## 2019-08-26 LAB — MAGNESIUM: Magnesium: 2.1 mg/dL (ref 1.7–2.4)

## 2019-08-26 MED ORDER — ETOMIDATE 2 MG/ML IV SOLN
10.0000 mg | Freq: Once | INTRAVENOUS | Status: AC
  Administered 2019-08-26: 10 mg via INTRAVENOUS

## 2019-08-26 MED ORDER — ETOMIDATE 2 MG/ML IV SOLN
INTRAVENOUS | Status: AC | PRN
  Administered 2019-08-26: 6 mg via INTRAVENOUS

## 2019-08-26 MED ORDER — SODIUM CHLORIDE 0.9% FLUSH
3.0000 mL | Freq: Once | INTRAVENOUS | Status: AC
  Administered 2019-08-26: 3 mL via INTRAVENOUS

## 2019-08-26 NOTE — Discharge Instructions (Addendum)
You were seen today for atrial fibrillation.  You were cardioverted in the ED.  Follow-up with your cardiologist.  Your lab work-up and other work-up is largely reassuring.  Continue medications as prescribed.

## 2019-08-26 NOTE — ED Notes (Signed)
Patient verbalizes understanding of discharge instructions. Opportunity for questioning and answers were provided. Armband removed by staff, pt discharged from ED.  

## 2019-08-26 NOTE — Telephone Encounter (Signed)
Patient on ED report. Reached out to patient to schedule ED f/u. Left voicemail to schedule appointment.

## 2019-08-26 NOTE — ED Provider Notes (Signed)
[place] EMERGENCY DEPARTMENT Provider Note   CSN: [id number] Arrival date & time: 08/26/19  0325     History Chief Complaint  Patient presents with  . Cardiac Arrhythmia    Phillip Raymond is a 59 y.o. male.  HPI     This is a 59 year old male with a history of atrial fibrillation on Eliquis and sleep apnea who presents with palpitations.  Patient reports that he awoke from sleep around 2:30 AM with palpitations.  He felt like his heart was in atrial fibrillation.  Denies any significant chest pain or shortness of breath.  No recent illnesses or fevers.  He does wear a BiPAP at night.  He states that he went to bed tired but otherwise feeling well.  Has required cardioversion in the past.  Denies lower extremity swelling or history of blood clots.  Past Medical History:  Diagnosis Date  . A-fib ([place])   . Colon polyp   . Insomnia   . Palpitations   . SOB (shortness of breath)     Patient Active Problem List   Diagnosis Date Noted  . Sleep apnea, unspecified 05/29/2019  . Atrial fibrillation ([place]) 04/11/2019    Past Surgical History:  Procedure Laterality Date  . HERNIA REPAIR    . SINUS VENOSUS CLOSURE         Family History  Problem Relation Age of Onset  . Diabetes Father   . Cancer - Colon Father     Social History   Tobacco Use  . Smoking status: Never Smoker  . Smokeless tobacco: Never Used  Substance Use Topics  . Alcohol use: Yes  . Drug use: Never    Home Medications Prior to Admission medications   Medication Sig Start Date End Date Taking? Authorizing Provider  apixaban (ELIQUIS) 5 MG TABS tablet Take 1 tablet (5 mg total) by mouth 2 (two) times daily. 04/11/19  Yes Nahser, Wonda Cheng, MD  Ascorbic Acid (VITAMIN C) 1000 MG tablet Take 1,000 mg by mouth 2 (two) times daily.  01/01/19  Yes [provider]  CARTIA XT 120 MG 24 hr capsule Take 1 capsule (120 mg total) by mouth daily. 04/11/19  Yes Nahser, Wonda Cheng, MD    cetirizine (ZYRTEC) 10 MG tablet Take 10 mg by mouth daily.   Yes [provider]  ferrous sulfate 325 (65 FE) MG tablet Take 325 mg by mouth 2 (two) times daily with a meal.  01/01/19  Yes [provider]  Polyethyl Glycol-Propyl Glycol (SYSTANE OP) Apply 2 drops to eye daily as needed (Dry eyes).   Yes [provider]  Turmeric 450 MG CAPS Take 450 mg by mouth 2 (two) times daily.    Yes [provider]  vitamin B-12 (CYANOCOBALAMIN) 1000 MCG tablet Take 1,000 mcg by mouth daily.  01/01/19  Yes [provider]  zolpidem (AMBIEN) 10 MG tablet Take 10 mg by mouth at bedtime. 04/26/15  Yes [provider]    Allergies    Patient has no known allergies.  Review of Systems   Review of Systems  Constitutional: Negative for fever.  Respiratory: Negative for shortness of breath.   Cardiovascular: Positive for palpitations. Negative for chest pain and leg swelling.  Gastrointestinal: Negative for abdominal pain, diarrhea and vomiting.  Genitourinary: Negative for dysuria.  All other systems reviewed and are negative.   Physical Exam Updated Vital Signs BP 112/68   Pulse 76   Temp 98.5 F (36.9 C) (Oral)  Resp 16   Ht 1.854 m (6\' 1" )   Wt 113.4 kg   SpO2 99%   BMI 32.98 kg/m   Physical Exam Vitals and nursing note reviewed.  Constitutional:      Appearance: He is well-developed.     Comments: Overweight  HENT:     Head: Normocephalic and atraumatic.     Mouth/Throat:     Mouth: Mucous membranes are moist.  Eyes:     Pupils: Pupils are equal, round, and reactive to light.  Cardiovascular:     Rate and Rhythm: Tachycardia present. Rhythm irregular.     Heart sounds: Normal heart sounds. No murmur.  Pulmonary:     Effort: Pulmonary effort is normal. No respiratory distress.     Breath sounds: Normal breath sounds. No wheezing.  Abdominal:     General: Bowel sounds are normal.     Palpations: Abdomen is soft.      Tenderness: There is no abdominal tenderness. There is no rebound.  Musculoskeletal:        General: No swelling or tenderness.     Cervical back: Neck supple.     Right lower leg: No edema.     Left lower leg: No edema.  Lymphadenopathy:     Cervical: No cervical adenopathy.  Skin:    General: Skin is warm and dry.  Neurological:     Mental Status: He is alert and oriented to person, place, and time.  Psychiatric:        Mood and Affect: Mood normal.     ED Results / Procedures / Treatments   Labs (all labs ordered are listed, but only abnormal results are displayed) Labs Reviewed  BASIC METABOLIC PANEL  CBC  MAGNESIUM    EKG EKG Interpretation  Date/Time:  Tuesday Aug 26 2019 03:23:21 EDT Ventricular Rate:  152 PR Interval:    QRS Duration: 80 QT Interval:  308 QTC Calculation: 489 R Axis:   -125 Text Interpretation: Atrial fibrillation with rapid ventricular response Right superior axis deviation Right ventricular hypertrophy Nonspecific ST abnormality Abnormal ECG Confirmed by 05-06-1996 (Ross Marcus) on 08/26/2019 3:53:10 AM   EKG Interpretation  Date/Time:  Tuesday Aug 26 2019 05:18:08 EDT Ventricular Rate:  64 PR Interval:    QRS Duration: 117 QT Interval:  390 QTC Calculation: 403 R Axis:   -49 Text Interpretation: Sinus rhythm Consider left atrial enlargement Incomplete right bundle branch block Inferior infarct, old Anterior infarct, old No change from Jan 2021 Confirmed by Feb 2021 (Ross Marcus) on 08/26/2019 5:19:57 AM       Radiology DG Chest Portable 1 View  Result Date: 08/26/2019 CLINICAL DATA:  Dyspnea EXAM: PORTABLE CHEST 1 VIEW COMPARISON:  04/04/2019 FINDINGS: Artifact from EKG leads. Normal heart size and mediastinal contours. No acute infiltrate or edema. No effusion or pneumothorax. No acute osseous findings. IMPRESSION: No active disease. Electronically Signed   By: 06/02/2019 M.D.   On: 08/26/2019 04:17     Procedures .Sedation  Date/Time: 08/26/2019 5:20 AM Performed by: 08/28/2019, MD Authorized by: Shon Baton, MD   Consent:    Consent obtained:  Verbal   Consent given by:  Patient   Risks discussed:  Allergic reaction, dysrhythmia, inadequate sedation, nausea, prolonged hypoxia resulting in organ damage, prolonged sedation necessitating reversal, respiratory compromise necessitating ventilatory assistance and intubation and vomiting   Alternatives discussed:  Analgesia without sedation, anxiolysis and regional anesthesia Universal protocol:    Procedure explained and questions answered to patient  or proxy's satisfaction: yes     Relevant documents present and verified: yes     Test results available and properly labeled: yes     Imaging studies available: yes     Required blood products, implants, devices, and special equipment available: yes     Site/side marked: yes     Immediately prior to procedure a time out was called: yes     Patient identity confirmation method:  Verbally with patient Indications:    Procedure necessitating sedation performed by:  Physician performing sedation Pre-sedation assessment:    Time since last food or drink:  2200   ASA classification: class 1 - normal, healthy patient     Neck mobility: normal     Mouth opening:  3 or more finger widths   Thyromental distance:  4 finger widths   Mallampati score:  I - soft palate, uvula, fauces, pillars visible   Pre-sedation assessments completed and reviewed: airway patency, cardiovascular function, hydration status, mental status, nausea/vomiting, pain level, respiratory function and temperature     Pre-sedation assessment completed:  08/26/2019 4:00 AM Immediate pre-procedure details:    Reassessment: Patient reassessed immediately prior to procedure     Reviewed: vital signs, relevant labs/tests and NPO status     Verified: bag valve mask available, emergency equipment available,  intubation equipment available, IV patency confirmed, oxygen available and suction available   Procedure details (see MAR for exact dosages):    Preoxygenation:  Nasal cannula   Sedation:  Etomidate   Intended level of sedation: deep   Intra-procedure monitoring:  Blood pressure monitoring, cardiac monitor, continuous pulse oximetry, frequent LOC assessments, frequent vital sign checks and continuous capnometry   Intra-procedure events: none     Total Provider sedation time (minutes):  20 Post-procedure details:    Attendance: Constant attendance by certified staff until patient recovered     Recovery: Patient returned to pre-procedure baseline     Post-sedation assessments completed and reviewed: airway patency, cardiovascular function, hydration status, mental status, nausea/vomiting, pain level, respiratory function and temperature     Patient is stable for discharge or admission: yes     Patient tolerance:  Tolerated well, no immediate complications   (including critical care time)  CRITICAL CARE Performed by: Shon Baton   Total critical care time: 45 minutes  Critical care time was exclusive of separately billable procedures and treating other patients.  Critical care was necessary to treat or prevent imminent or life-threatening deterioration.  Critical care was time spent personally by me on the following activities: development of treatment plan with patient and/or surrogate as well as nursing, discussions with consultants, evaluation of patient's response to treatment, examination of patient, obtaining history from patient or surrogate, ordering and performing treatments and interventions, ordering and review of laboratory studies, ordering and review of radiographic studies, pulse oximetry and re-evaluation of patient's condition.   Medications Ordered in ED Medications  sodium chloride flush (NS) 0.9 % injection 3 mL (3 mLs Intravenous Given 08/26/19 0400)  etomidate  (AMIDATE) injection 10 mg (10 mg Intravenous Given by Other 08/26/19 0454)  etomidate (AMIDATE) injection (6 mg Intravenous Given 08/26/19 0431)    ED Course  I have reviewed the triage vital signs and the nursing notes.  Pertinent labs & imaging results that were available during my care of the patient were reviewed by me and considered in my medical decision making (see chart for details).    MDM Rules/Calculators/A&P  Patient presents in atrial fibrillation with RVR.  History of the same and on Eliquis.  Reports compliance.  He is overall nontoxic and vital signs are notable for tachycardia.  EKG is consistent with atrial fibrillation with RVR.  No obvious ischemic changes.  Patient is chest pain-free.  Atrial fibrillation order set used.  No significant metabolic derangements.  Discussed with patient the risk and benefits of bedside cardioversion.  Feel he is low risk.  Patient consented to cardioversion.  Cardioversion successful without any complication.  See notes above.  Chest x-ray without any evidence of pneumothorax or pneumonia.  Repeat EKG with patient in normal sinus rhythm.  Feel patient is stable for discharge with cardiology follow-up.  This patients CHA2DS2-VASc Score and unadjusted Ischemic Stroke Rate (% per year) is equal to 0.2 % stroke rate/year from a score of 0  Above score calculated as 1 point each if present [CHF, HTN, DM, Vascular=MI/PAD/Aortic Plaque, Age if 65-74, or Male] Above score calculated as 2 points each if present [Age > 75, or Stroke/TIA/TE]  After history, exam, and medical workup I feel the patient has been appropriately medically screened and is safe for discharge home. Pertinent diagnoses were discussed with the patient. Patient was given return precautions.   Final Clinical Impression(s) / ED Diagnoses Final diagnoses:  Atrial fibrillation with RVR [facility])    Rx / DC Orders ED Discharge Orders         Ordered    Amb  referral to AFIB Clinic     08/26/19 0410           Shon Baton, MD 08/26/19 [phone]

## 2019-08-26 NOTE — ED Triage Notes (Addendum)
To ED for eval of sob that woke pt up. States he feels like he's in afib. Hx of afib - cardioverted in Jan. No CP. Skin w/d. Resp even - pt states feels labored. Pt is on Eliquis. Took a diltiazem prior to coming to ED.

## 2019-08-26 NOTE — ED Provider Notes (Signed)
  [facility] EMERGENCY DEPARTMENT Provider Note      Procedures .Cardioversion  Date/Time: 08/26/2019 4:37 AM Performed by: Roxy Horseman, PA-C Authorized by: Shon Baton, MD   Consent:    Consent obtained:  Written and emergent situation   Consent given by:  Patient   Risks discussed:  Cutaneous burn, death, induced arrhythmia and pain   Alternatives discussed:  Rate-control medication Pre-procedure details:    Cardioversion basis:  Emergent   Rhythm:  Atrial fibrillation   Electrode placement:  Anterior-posterior Patient sedated: Yes. Refer to sedation procedure documentation for details of sedation.  Attempt one:    Cardioversion mode:  Synchronous   Waveform:  Biphasic   Shock (Joules):  150   Shock outcome:  Conversion to normal sinus rhythm Post-procedure details:    Patient status:  Awake   Patient tolerance of procedure:  Tolerated well, no immediate complications       Roxy Horseman, PA-C 08/26/19 0439    Shon Baton, MD 08/26/19 [phone]

## 2019-09-11 ENCOUNTER — Ambulatory Visit (HOSPITAL_COMMUNITY)
Admission: RE | Admit: 2019-09-11 | Discharge: 2019-09-11 | Disposition: A | Discharge status: Home / Self Care | Payer: 59 | Source: Ambulatory Visit | Attending: Nurse Practitioner | Admitting: Nurse Practitioner

## 2019-09-11 ENCOUNTER — Other Ambulatory Visit: Payer: Self-pay

## 2019-09-11 VITALS — BP 118/76 | HR 60 | Ht 73.0 in | Wt 261.6 lb

## 2019-09-11 DIAGNOSIS — Z7901 Long term (current) use of anticoagulants: Secondary | ICD-10-CM | POA: Insufficient documentation

## 2019-09-11 DIAGNOSIS — G4733 Obstructive sleep apnea (adult) (pediatric): Secondary | ICD-10-CM | POA: Insufficient documentation

## 2019-09-11 DIAGNOSIS — I480 Paroxysmal atrial fibrillation: Secondary | ICD-10-CM | POA: Diagnosis present

## 2019-09-11 DIAGNOSIS — Z9989 Dependence on other enabling machines and devices: Secondary | ICD-10-CM | POA: Insufficient documentation

## 2019-09-11 DIAGNOSIS — Z79899 Other long term (current) drug therapy: Secondary | ICD-10-CM | POA: Diagnosis not present

## 2019-09-11 DIAGNOSIS — D6869 Other thrombophilia: Secondary | ICD-10-CM

## 2019-09-12 NOTE — Progress Notes (Signed)
Primary Care Physician: Crista Elliot, PA-C Referring Physician: [facility] ER f/u Cardiologist: Dr. Acie Fredrickson Sleep specialist - Dr. Rochele Raring Phillip Raymond is a 59 y.o. male with a h/o paroxysmal afib that is in the clinic for f/u after cardioversion in the ER. This  is the 3rd ER visit with cardioversion, last 6 months ago. He has RVR in the 150's and is symptomatic with this. HE is in SR today. He is on eliquis 5 mg bid for a CHA2DS2VASc score of 0. He has sleep apnea treated with BiPap and is compliant. He is planning to move to [place] in a few weeks and may try to be seen at the [facility]. No significant alcohol or caffeine. He has had both covid shots.  Today, he denies symptoms of palpitations, chest pain, shortness of breath, orthopnea, PND, lower extremity edema, dizziness, presyncope, syncope, or neurologic sequela. The patient is tolerating medications without difficulties and is otherwise without complaint today.   Past Medical History:  Diagnosis Date  . A-fib ([place])   . Colon polyp   . Insomnia   . Palpitations   . SOB (shortness of breath)    Past Surgical History:  Procedure Laterality Date  . HERNIA REPAIR    . SINUS VENOSUS CLOSURE      Current Outpatient Medications  Medication Sig Dispense Refill  . apixaban (ELIQUIS) 5 MG TABS tablet Take 1 tablet (5 mg total) by mouth 2 (two) times daily. 180 tablet 3  . Ascorbic Acid (VITAMIN C) 1000 MG tablet Take 1,000 mg by mouth 2 (two) times daily.     Marland Kitchen CARTIA XT 120 MG 24 hr capsule Take 1 capsule (120 mg total) by mouth daily. 90 capsule 3  . cetirizine (ZYRTEC) 10 MG tablet Take 10 mg by mouth daily.    . ferrous sulfate 325 (65 FE) MG tablet Take 325 mg by mouth 2 (two) times daily with a meal.     . Polyethyl Glycol-Propyl Glycol (SYSTANE OP) Apply 2 drops to eye daily as needed (Dry eyes).    . Turmeric 450 MG CAPS Take 450 mg by mouth 2 (two) times daily.     . vitamin B-12 (CYANOCOBALAMIN) 1000 MCG tablet  Take 1,000 mcg by mouth daily.     Marland Kitchen zolpidem (AMBIEN) 10 MG tablet Take 10 mg by mouth at bedtime.     No current facility-administered medications for this encounter.    No Known Allergies  Social History   Socioeconomic History  . Marital status: Married    Spouse name: Not on file  . Number of children: Not on file  . Years of education: Not on file  . Highest education level: Not on file  Occupational History  . Not on file  Tobacco Use  . Smoking status: Never Smoker  . Smokeless tobacco: Never Used  Substance and Sexual Activity  . Alcohol use: Yes  . Drug use: Never  . Sexual activity: Not on file  Other Topics Concern  . Not on file  Social History Narrative  . Not on file   Social Determinants of Health   Financial Resource Strain:   . Difficulty of Paying Living Expenses:   Food Insecurity:   . Worried About Charity fundraiser in the Last Year:   . Arboriculturist in the Last Year:   Transportation Needs:   . Film/video editor (Medical):   Marland Kitchen Lack of Transportation (Non-Medical):   Physical Activity:   .  Days of Exercise per Week:   . Minutes of Exercise per Session:   Stress:   . Feeling of Stress :   Social Connections:   . Frequency of Communication with Friends and Family:   . Frequency of Social Gatherings with Friends and Family:   . Attends Religious Services:   . Active Member of Clubs or Organizations:   . Attends Banker Meetings:   Marland Kitchen Marital Status:   Intimate Partner Violence:   . Fear of Current or Ex-Partner:   . Emotionally Abused:   Marland Kitchen Physically Abused:   . Sexually Abused:     Family History  Problem Relation Age of Onset  . Diabetes Father   . Cancer - Colon Father     ROS- All systems are reviewed and negative except as per the HPI above  Physical Exam: Vitals:   09/11/19 1414  BP: 118/76  Pulse: 60  Weight: 118.7 kg  Height: 6\' 1"  (1.854 m)   Wt Readings from Last 3 Encounters:  09/11/19 118.7  kg  08/26/19 113.4 kg  08/01/19 113.4 kg    Labs: Lab Results  Component Value Date   NA 140 08/26/2019   K 4.0 08/26/2019   CL 105 08/26/2019   CO2 28 08/26/2019   GLUCOSE 99 08/26/2019   BUN 11 08/26/2019   CREATININE 0.84 08/26/2019   CALCIUM 9.8 08/26/2019   MG 2.1 08/26/2019   Lab Results  Component Value Date   INR 1.14 04/29/2015   No results found for: CHOL, HDL, LDLCALC, TRIG   GEN- The patient is well appearing, alert and oriented x 3 today.   Head- normocephalic, atraumatic Eyes-  Sclera clear, conjunctiva pink Ears- hearing intact Oropharynx- clear Neck- supple, no JVP Lymph- no cervical lymphadenopathy Lungs- Clear to ausculation bilaterally, normal work of breathing Heart- Regular rate and rhythm, no murmurs, rubs or gallops, PMI not laterally displaced GI- soft, NT, ND, + BS Extremities- no clubbing, cyanosis, or edema MS- no significant deformity or atrophy Skin- no rash or lesion Psych- euthymic mood, full affect Neuro- strength and sensation are intact  EKG-NSR at 60 bpm, pr int 168, qrs int 98 ms, qtc 384 ms    Assessment and Plan: 1. Paroxysmal afib  Pt has had 3 episodes all treated with DCCV in the ER He states that he is very symptomatic with HR's in the 150's when in afib Discussed AAD therapy but he is not ready He is planning to move to [place] soon and plans to establish at [facility] while there He may move back to this area in one year Continue cartia 120 mg daily  2. CHA2DS2VASc score of 0 Continue eliquis 5 mg bid  Is aware that he needs to take this for 4 weeks after cardioversion  3. OSA continue bipap  afib clinic as needed   [facility] C. Lupita Leash [facility] [address] [place], [place] [phone]

## 2019-10-28 ENCOUNTER — Telehealth: Payer: Self-pay | Admitting: Cardiology

## 2019-10-28 NOTE — Telephone Encounter (Signed)
Maryanne the patient's wife is calling requesting Phillip Raymond's Medical Records be faxed over to Dr. Haze Rushing in [place] due to them moving. She states the fax number is [phone]. She is requesting both the medical records from Dr. Elease Hashimoto and Dr. Mayford Knife be sent due to the patient being scheduled to be seen on Friday, 10/31/19 . Please advise.

## 2020-02-05 ENCOUNTER — Telehealth: Payer: Self-pay | Admitting: *Deleted

## 2020-02-05 ENCOUNTER — Encounter: Payer: Self-pay | Admitting: *Deleted

## 2020-02-05 NOTE — Telephone Encounter (Addendum)
Patient called needing documentation for his insurance carrier [employer] explaining the medical necessity of being referred for a sleep study by dr Elease Hashimoto. Dr Harvie Bridge 01/09/2019 explains the necessity for the sleep study and patient request this note to be sent to his insurance. Patient was informed he would have to sign a release form in order to get his information released and patient agrees. Patient would like the form emailed to him at [email or web address] and he understands the information can not be released until this form is filled out and returned to our HIM department. After the form is received back our HIM department will send his information to [employer].     This encounter was created in error - please disregard.

## 2020-02-05 NOTE — Telephone Encounter (Signed)
-----   Message from Quintella Reichert, MD sent at 02/04/2020  8:46 PM EDT ----- Regarding: RE: medical necessity letter Just write a note stating that Dr. Elease Hashimoto ordered sleep study due to pulmonary HTN and sleep study showed OSA and he needs to be on PAP therapy to prevent worsening pulmonary HTN  Traci ----- Message ----- From: Reesa Chew, CMA Sent: 02/04/2020   5:39 PM EDT To: Quintella Reichert, MD Subject: RE: medical necessity letter                   Patients insurance is [employer]- [employer] research officer, [employer]. Patient states they want a letter stating his sleep study was a medical necessity. I found a note from dr Elease Hashimoto on 01/09/2019 that explains why the sleep study was ordered not sure if this is what he needs or not I have printed out the note and here is the part where he ordered it..... I reviewed the result of the mildly elevated pulmonary pressure and asked if he has ever been diagnosed with sleep apnea. He confirms that he was tested in the past but at that time he weighed less and his test results did not show sleep apnea. I reviewed the Epworth Sleepiness tool with him and his score was 18. I advised that I will make a referral for a sleep study and that our office will contact him regarding the scheduling of the sleep test. He also requests referral to Dr. Mayford Knife for sleep medicine. I advised I will also send message to Dr. Elease Hashimoto for additional advice. Patient and his wife were thankful for the call  I agree with the plan to proceed with a sleep study and refer to Dr. Mayford Knife if needed. ----- Message ----- From: Quintella Reichert, MD Sent: 02/04/2020  11:53 AM EDT To: Reesa Chew, CMA, Kizzy Owens Loffler Subject: RE: medical necessity letter                   Coralee North please find out what this patient needs and why  Traci ----- Message ----- From: Lynnae Sandhoff Sent: 02/04/2020  11:40 AM EDT To: Quintella Reichert, MD Subject: RE: medical necessity letter                   He left a  message saying it was for insurance purposes for his services with you. ----- Message ----- From: Quintella Reichert, MD Sent: 02/04/2020  11:23 AM EDT To: Lynnae Sandhoff Subject: RE: medical necessity letter                   What is it for ----- Message ----- From: Lynnae Sandhoff Sent: 02/04/2020  11:18 AM EDT To: Quintella Reichert, MD Subject: medical necessity letter                       Pt called in stating he needs a letter of medical necessity sent to him. Please advise your clinical staff.  Thank you

## 2020-02-05 NOTE — Telephone Encounter (Signed)
Patient called needing documentation for his insurance carrier [employer] explaining the medical necessity of being referred for a sleep study by dr Elease Hashimoto. Dr Harvie Bridge 01/09/2019 explains the necessity for the sleep study and patient request this note to be sent to his insurance. Patient was informed he would have to sign a release form in order to get his information released and patient agrees. Patient would like the form emailed to him at [email or web address] and he understands the information can not be released until this form is filled out and returned to our HIM department. After the form is received back our HIM department will send his information to [employer].

## 2020-02-13 NOTE — Telephone Encounter (Signed)
Wife called complaining they received a bill for $3000 for patients sleep study on 06/17/19 with insurance saying his sleep study was not medically necessity. Burna Mortimer had pre-certed all of his studies and no PA was required. Pt's wife was given the [facility] Billing number [id number] to call and get her questions answered.

## 2021-07-01 IMAGING — DX DG CHEST 1V PORT
1 series · 2 of 2 positions shown · non-contrast
Comparison: 04/04/2019

CLINICAL DATA: Dyspnea

EXAM:
PORTABLE CHEST 1 VIEW

[Series 1: chest · 0.14mm/px · 2 of 2 slices shown]
[im 1/2]
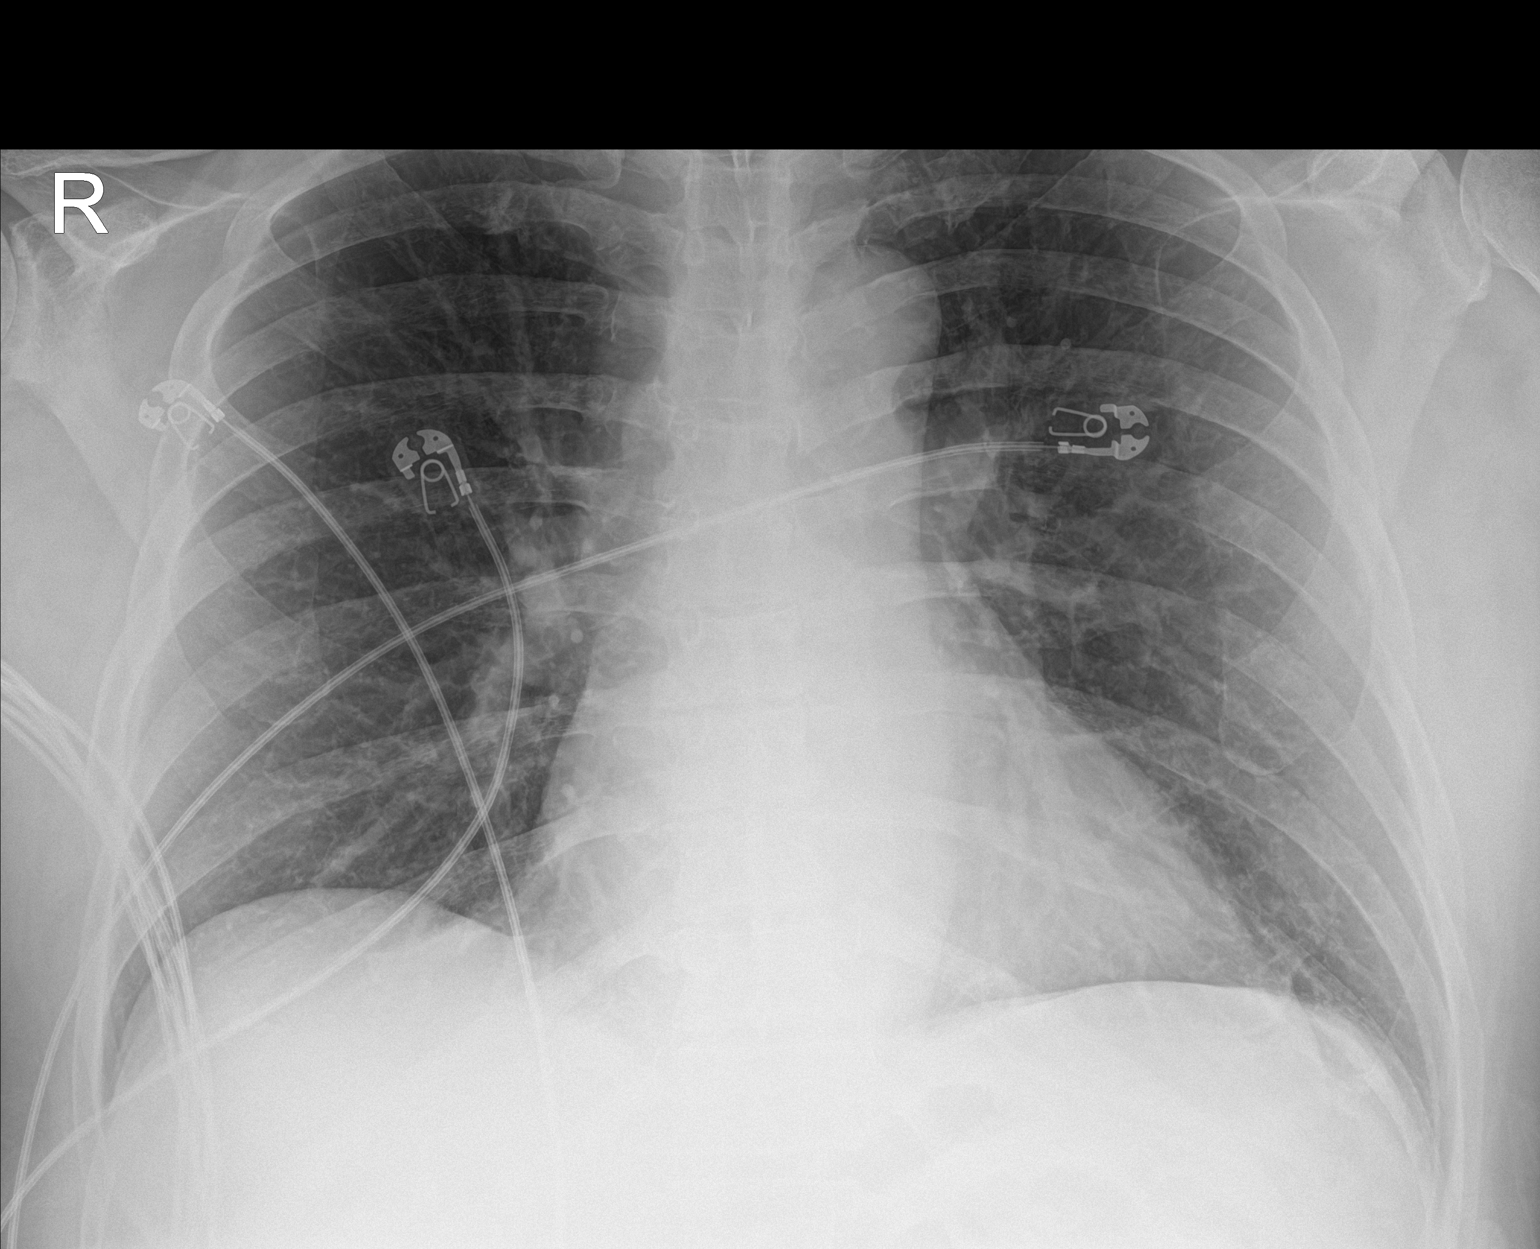
[im 2/2]
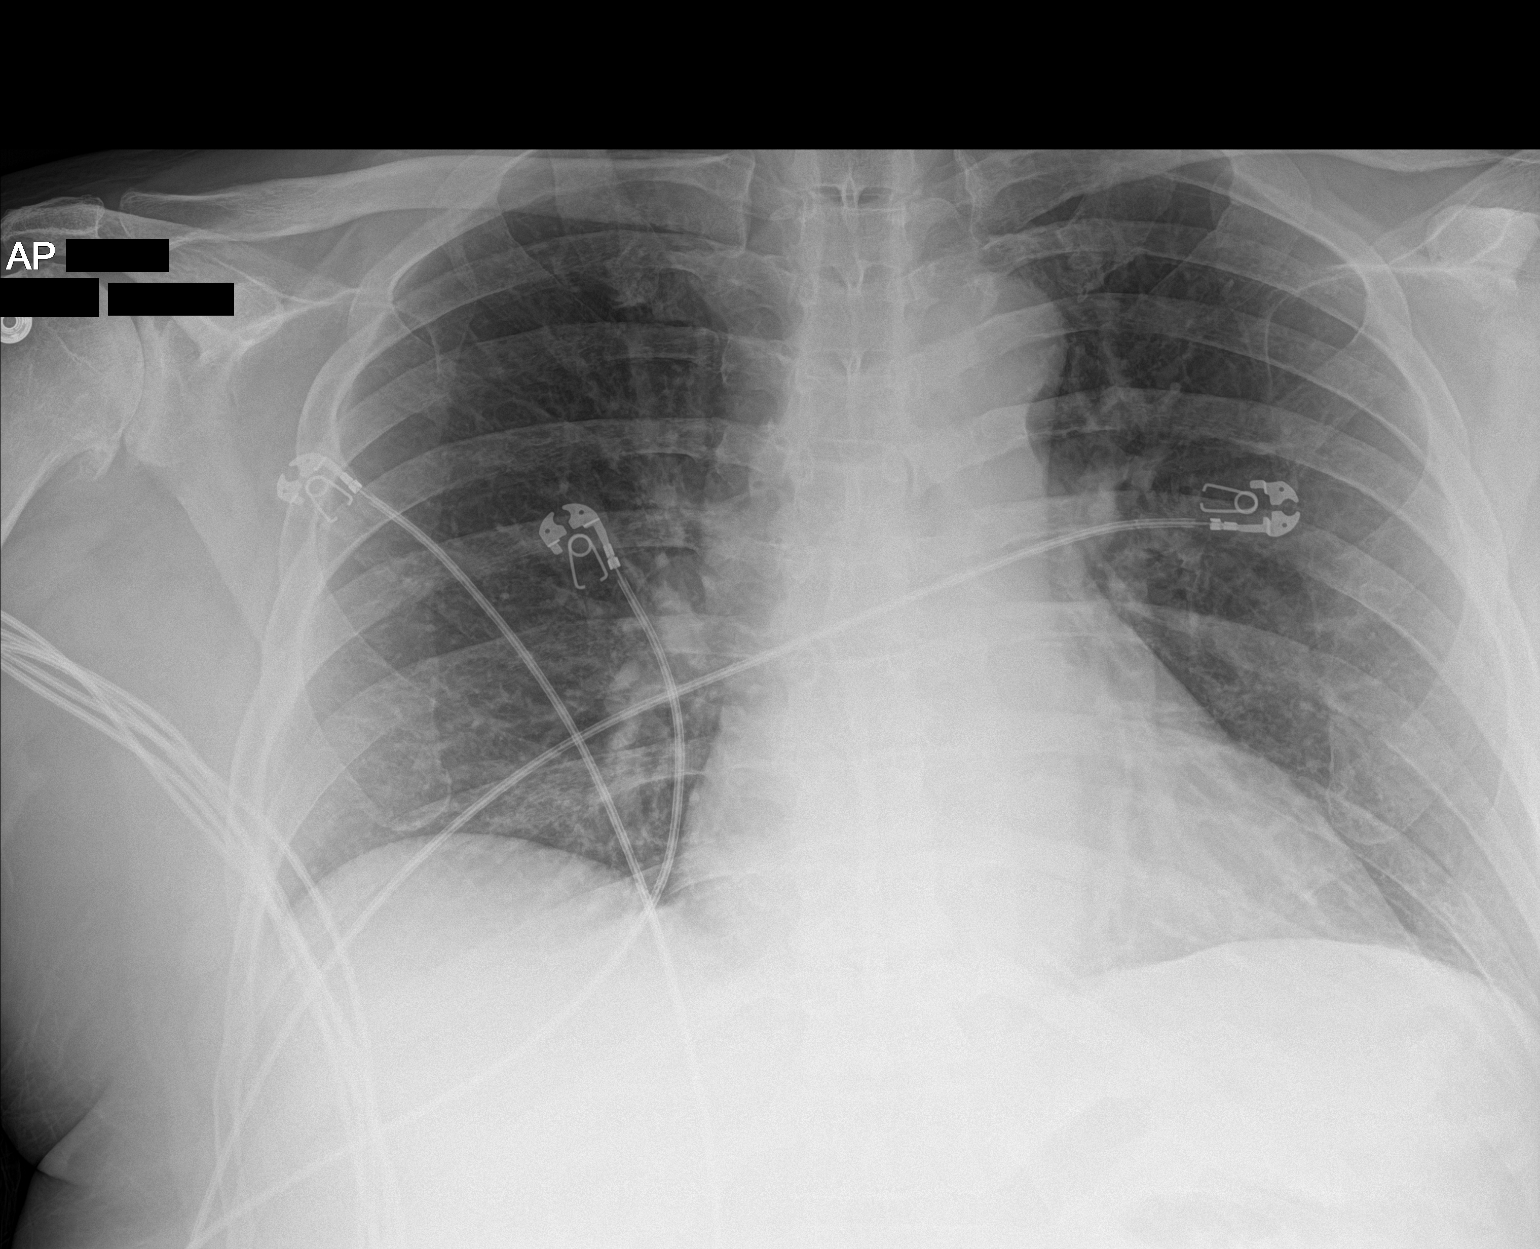

[2 of 2 positions shown; findings below may reference images not displayed]

FINDINGS: Artifact from EKG leads.

Normal heart size and mediastinal contours. No acute infiltrate or
edema. No effusion or pneumothorax. No acute osseous findings.
IMPRESSION: No active disease.

## 2022-01-03 ENCOUNTER — Encounter: Payer: Self-pay | Admitting: Cardiovascular Disease

## 2022-01-03 NOTE — Progress Notes (Signed)
This encounter was created in error - please disregard.

## 2022-01-09 ENCOUNTER — Encounter: Payer: PRIVATE HEALTH INSURANCE | Admitting: Cardiovascular Disease

## 2022-05-11 ENCOUNTER — Encounter (HOSPITAL_COMMUNITY): Payer: Self-pay | Admitting: *Deleted
